# Patient Record
Sex: Male | Born: 1980 | Race: White | Hispanic: Yes | Marital: Married | Smoking: Former smoker
Health system: Southern US, Community
[De-identification: ages and names within clinical notes are randomized; demographics above are authoritative.]

## PROBLEM LIST (undated history)

## (undated) DIAGNOSIS — K922 Gastrointestinal hemorrhage, unspecified: Secondary | ICD-10-CM

## (undated) DIAGNOSIS — K259 Gastric ulcer, unspecified as acute or chronic, without hemorrhage or perforation: Secondary | ICD-10-CM

## (undated) DIAGNOSIS — F1010 Alcohol abuse, uncomplicated: Secondary | ICD-10-CM

## (undated) DIAGNOSIS — Z789 Other specified health status: Secondary | ICD-10-CM

## (undated) HISTORY — PX: NO PAST SURGERIES: SHX2092

---
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
## 2002-07-24 ENCOUNTER — Encounter: Payer: Self-pay | Admitting: Emergency Medicine

## 2002-07-25 ENCOUNTER — Inpatient Hospital Stay (HOSPITAL_COMMUNITY): Admission: EM | Admit: 2002-07-25 | Discharge: 2002-07-27 | Payer: Self-pay | Admitting: Emergency Medicine

## 2007-08-10 ENCOUNTER — Emergency Department (HOSPITAL_COMMUNITY): Admission: EM | Admit: 2007-08-10 | Discharge: 2007-08-10 | Payer: Self-pay | Admitting: Emergency Medicine

## 2007-09-08 ENCOUNTER — Emergency Department (HOSPITAL_COMMUNITY): Admission: EM | Admit: 2007-09-08 | Discharge: 2007-09-08 | Payer: Self-pay | Admitting: Emergency Medicine

## 2007-11-09 ENCOUNTER — Ambulatory Visit (HOSPITAL_COMMUNITY): Admission: RE | Admit: 2007-11-09 | Discharge: 2007-11-09 | Payer: Self-pay | Admitting: Orthopaedic Surgery
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
## 2011-02-18 NOTE — H&P (Signed)
Calvin Carter, Calvin Carter NO.:  [id number]   MEDICAL RECORD NO.:  [id number]                   PATIENT TYPE:  INP   LOCATION:  IC07                                 FACILITY:  [facility]   PHYSICIAN:  Gracelyn Nurse, M.D.              DATE OF BIRTH:  03-25-1981   DATE OF ADMISSION:  07/24/2002  DATE OF DISCHARGE:                                HISTORY & PHYSICAL   CHIEF COMPLAINT:  Chest pain.   HISTORY OF PRESENT ILLNESS:  This is a 30 year old Hispanic male who speaks  no [place].  He presents to the ER with chest pain.  While out in triage he  collapsed.  He was brought back and started on IV fluids, found to be  hypotensive and found to have a hemoglobin of 7.  His brother-in-law, who  was with him, says that he [facility] and  diagnosed and treated for gastritis with medications that he takes twice a  day and told not to drink any alcohol. He since then has been drinking  alcohol.  He says that he vomited bright red blood x1 yesterday and has  started having black-looking stools.  He denies taking any ibuprofen,  aspirin or any powders of any kind.   PAST MEDICAL HISTORY:  Recent diagnosis of gastritis.   ALLERGIES:  No known drug allergy.   CURRENT MEDICATIONS:  Likely an H2 blocker.   SOCIAL HISTORY:  He smokes a pack a day.  He drinks 6 to 8 beers a day.  He  is single.   FAMILY HISTORY:  Mother is in good health.  He does not know his father.   REVIEW OF SYSTEMS:  Was not obtained from the patient because he is sedated  from morphine and Phenergan.   PHYSICAL EXAMINATION:  VITAL SIGNS:  Temperature is 98, pulse 101, respirations 14, blood pressure  90/44.  GENERAL:  This is a well-nourished Hispanic male who has been sedated from  the morphine and Phenergan but will open his eyes.  HEENT:  Pupils equal, round and reactive to light.  Equal ocular movement  intact.  Oral mucosa is dry.  Oropharynx is clear.  CARDIOVASCULAR:  Tachy with no murmurs.  LUNGS:  Clear to auscultation.  ABDOMEN:  Soft, nontender, nondistended.  Bowel sounds are positive.  EXTREMITIES:  No edema.  NEUROLOGIC:  He is sedated but moves all extremities and will respond to  commands.  RECTAL:  Coag-positive.  There is black, tarry stool but no frank melena.   ADMISSION LABORATORIES:  White blood cells 12.5, hemoglobin 7, platelets  251, MCV 81, sodium is 134, potassium 3.7, chloride 105, CO2 24, BUN 31,  creatinine 0.9, glucose is 211.   ASSESSMENT/PLAN:  1. Gastrointestinal bleeding.  It is likely upper gastrointestinal.  He was     diagnosed clinically with gastritis.  He could  have an erosive gastritis     or even an ulcer.  Since he was complaining of chest pain, esophagitis     also has to be considered.  We will go ahead and admit him to the ICU and     transfuse blood, start a proton-pump inhibitor and consult GI for EDT.  2. Anemia.  This is secondary to #1.  Will transfuse him and check iron     studies.  3. Hyperglycemia.  He has no history of diabetes.  This may just be stress     related but we will check a hemoglobin A1C, check a lipase to rule out     pancreatitis and put him on some sliding scale insulin.                                               Gracelyn Nurse, M.D.    JDJ/MEDQ  D:  07/25/2002  T:  07/25/2002  Job:  [id number]

## 2011-02-18 NOTE — Discharge Summary (Signed)
   NAMEANCELMO, HUNT NO.:  [id number]   MEDICAL RECORD NO.:  [id number]                   PATIENT TYPE:  INP   LOCATION:  A223                                 FACILITY:  [facility]   PHYSICIAN:  Hanley Hays. Dechurch, M.D.           DATE OF BIRTH:  02/14/1981   DATE OF ADMISSION:  07/24/2002  DATE OF DISCHARGE:  07/27/2002                                 DISCHARGE SUMMARY   DIAGNOSES:  1. Anemia secondary to gastrointestinal bleed.  2. Duodenal ulcer status post heater probe and epinephrine injection.  3. Helicobacter pylori positive.  4. Hypoalbuminemia.  5. Elevated glucose with normal hemoglobin A1c.   CONDITION:  Improved.   PROCEDURE:  Endoscopy, status post 3 unit packed red blood cells  transfusion.   MEDICATIONS:  Protonix or PPI of choice daily (to be arranged via Dr.  Luvenia Starch office for samples).   FOLLOW-UP:  1. H&H July 29, 2002.  2. Follow-up with Dr. Jena Gauss to be scheduled July 29, 2002.  3. H. pylori treatment, again, per Dr. Jena Gauss, who will provide medication     for the patient for treatment.   HOSPITAL COURSE:  A 30 year old Hispanic male who speaks no English  presented to the emergency room complaining of chest pain, had a syncopal  episode in triage.  Subsequent workup revealed a hemoglobin of 7.  He had  heme positive stools.  He underwent endoscopy which revealed duodenal  ulceration with bulbar ulcer which was treated with thermal ablation  injection therapy.  The patient subsequently had his H. pylori return at  2.99.  Arrangements were made for him to obtain medications as an outpatient  and he was discharged to home in improved condition.  Hemoglobin at the time  of discharge was 8.9 and stable.  He had no symptoms and felt well, was  taking a full diet without any complaints.   PHYSICAL EXAMINATION:  GENERAL:  Reveals a thin Hispanic male, no distress.  LUNGS:  Clear.  ABDOMEN:  Soft, nontender, active bowel  sounds.  EXTREMITIES:  Without clubbing, cyanosis, or edema.  NEUROLOGIC:  Grossly intact as best can be assessed with the language  barriers.    DISPOSITION:  Interpretation was obtained to help explain discharge planning  with the patient and family and they seemed to have reasonable  understanding.                                                 Hanley Hays Josefine Class, M.D.    FED/MEDQ  D:  09/27/2002  T:  09/28/2002  Job:  [id number]

## 2011-02-18 NOTE — Discharge Summary (Signed)
NAMEHAYVEN, Calvin Carter NO.:  [id number]   MEDICAL RECORD NO.:  [id number]                   PATIENT TYPE:  INP   LOCATION:  A223                                 FACILITY:  [facility]   PHYSICIAN:  Calvin Hays. Dechurch, M.D.           DATE OF BIRTH:  Dec 28, 1980   DATE OF ADMISSION:  07/24/2002  DATE OF DISCHARGE:  07/27/2002                                 DISCHARGE SUMMARY   DISCHARGE DIAGNOSES:  1. Gastrointestinal bleeding secondary to bulbar ulcer requiring packed red     blood cell transfusion.  2. Acute anemia.  3. Hyperglycemia with normal hemoglobin A1C.  4. Helicobacter pylori-positive.   DISPOSITION:  The patient is discharged to home.   MEDICATIONS:  Protonix or PPI available b.i.d.  Prevpac.  Supplies will be  arranged to be obtained through Dr. Patty Sermons office.  The patient was  advised to take no nonsteroidals, aspirin, or over-the-counter medicines  with the exception of Tylenol.   HOSPITAL COURSE:  The patient is a 30 year old healthy Hispanic gentleman  who presented with nausea, vomiting, and hypotension.  Hemoglobin upon  presentation was 7.  Review of systems revealed the patient had been using  nonsteroidals.  The patient spoke no [place], and all information was  obtained through translators and family members.  The patient underwent  endoscopy per Dr. Roetta Sessions.  Findings revealed a normal esophagus.  The  stomach revealed a bulbar ulcer with lots of inflammatory changes.  He  underwent thermal ablation.  The patient tolerated the procedure well.  His  hemoglobin at the time of discharge was 8.7.  He had no evidence of blood in  the stools.  He was eating well, feeling well, was not symptomatic.  The  patient was discharged, to follow up with Dr. Jena Gauss.  His H. pylori did come  back positive and, as noted above, plans were made to obtain medicine for  the patient as an outpatient.   PHYSICAL EXAMINATION:  GENERAL:  Thin  Hispanic white male in no distress.  VITAL SIGNS:  Blood pressure 120/70, pulse 78 and regular, respirations  unlabored.  HEENT:  Oropharynx moist.  NECK:  Supple.  LUNGS:  Clear.  ABDOMEN:  Flat.  Soft and nontender.  With active bowel sounds.  HEART:  Regular rate and rhythm.  EXTREMITIES:  Without clubbing, cyanosis, or edema.  SKIN:  Without rash, lesion, or breakdown.  NEUROLOGIC:  Intact.    ASSESSMENT AND PLAN:  As noted above.  The patient has follow-up arranged  with Drs. Rourk and Rehman in one month.                                               Calvin Carter, M.D.    FED/MEDQ  D:  08/29/2002  T:  08/29/2002  Job:  [id number]

## 2011-02-18 NOTE — Op Note (Signed)
Calvin Carter, Calvin Carter                           ACCOUNT NO.:  [id number]   MEDICAL RECORD NO.:  [id number]                   PATIENT TYPE:  INP   LOCATION:  IC07                                 FACILITY:  [facility]   PHYSICIAN:  R. Roetta Sessions, M.D.              DATE OF BIRTH:  09/01/1981   DATE OF PROCEDURE:  07/25/2002  DATE OF DISCHARGE:                                 OPERATIVE REPORT   PROCEDURE:  Esophagogastroduodenoscopy with thermal sealing and injection  therapy for bleeding duodenal lesion.   INDICATION FOR PROCEDURE:  The patient is a 30 year old Hispanic male  admitted to the hospital with Hemoccult-positive stool, profound anemia,  hemoglobin 7.  He has received three units of packed red blood cells.  EGD  is now being done to further evaluate his symptoms.  This approach has been  discussed with the patient via an interpreter.  The potential risks,  benefits, and alternatives have been reviewed and questions answered.  He  has been started on IV Protonix.  EGD is now being done with therapy to  contain as appropriate.  This approach has been discussed with the patient.  ASA-1.   DESCRIPTION OF PROCEDURE:  O2 saturation, blood pressure, pulse, and  respirations were monitored throughout the entire procedure.   Conscious sedation:  6 mg of Versed, Demerol 125 mg in divided doses.   Instrument:  Olympus video chip adult gastroscope.   Findings:  Examination of the tubular esophagus revealed no mucosal  abnormalities.  The EG junction was easily traversed.   Stomach:  Gastric cavity was empty, insufflated well with air.  A very  thorough examination of the gastric mucosa, including retroflexed view of  the proximal stomach and esophagogastric junction, demonstrated no  abnormalities.  Pylorus patent and easily traversed.  Examination of the  bulb revealed mucosal edema with a 0.5 cm ulcer in the bulb with brisk  oozing out of the adjacent mucosa, please see photos.  D2  appeared normal.  This area of brisk oozing was treated x2 with a heater probe at 30 joules  each.  Oozing persisted, 4 cc of 1:100,000 epinephrine was injected in the  area with a decrease in the amount of oozing.  Bleeding slowed considerably.  The patient became uncooperative during the procedure, which limited further  treatment.  The patient overall tolerated the procedure very well and was  reactivated in endoscopy.   IMPRESSION:  1. Normal esophagus.  2. Normal stomach.  3. Bulbar ulcer with surrounding inflammatory changes and oozing mucosa,     treated with thermal ablation and injection therapy as described above.     D2 appeared normal.   RECOMMENDATIONS:  1. Follow H&H closely.  2. Continue IV PPI.  3. Carafate 1 g slurry q.i.d.  4. Check H. pylori status.  The patient should make sure that he does not     take any nonsteroidals  in the future.  5. Further recommendations to follow.                                               Jonathon Bellows, M.D.    RMR/MEDQ  D:  07/25/2002  T:  07/25/2002  Job:  [id number]   cc:   Gracelyn Nurse, M.D.

## 2014-05-04 ENCOUNTER — Emergency Department (HOSPITAL_COMMUNITY): Payer: Medicaid Other

## 2014-05-04 ENCOUNTER — Inpatient Hospital Stay (HOSPITAL_COMMUNITY)
Admission: EM | Admit: 2014-05-04 | Discharge: 2014-05-07 | DRG: 378 | Disposition: A | Discharge status: Home / Self Care | Payer: Medicaid Other | Attending: Internal Medicine | Admitting: Internal Medicine

## 2014-05-04 ENCOUNTER — Encounter (HOSPITAL_COMMUNITY): Payer: Self-pay | Admitting: Emergency Medicine

## 2014-05-04 DIAGNOSIS — F1010 Alcohol abuse, uncomplicated: Secondary | ICD-10-CM | POA: Diagnosis present

## 2014-05-04 DIAGNOSIS — K259 Gastric ulcer, unspecified as acute or chronic, without hemorrhage or perforation: Secondary | ICD-10-CM | POA: Diagnosis present

## 2014-05-04 DIAGNOSIS — Z8711 Personal history of peptic ulcer disease: Secondary | ICD-10-CM

## 2014-05-04 DIAGNOSIS — R197 Diarrhea, unspecified: Secondary | ICD-10-CM

## 2014-05-04 DIAGNOSIS — F17200 Nicotine dependence, unspecified, uncomplicated: Secondary | ICD-10-CM | POA: Diagnosis present

## 2014-05-04 DIAGNOSIS — E876 Hypokalemia: Secondary | ICD-10-CM | POA: Diagnosis present

## 2014-05-04 DIAGNOSIS — R112 Nausea with vomiting, unspecified: Secondary | ICD-10-CM

## 2014-05-04 DIAGNOSIS — D62 Acute posthemorrhagic anemia: Secondary | ICD-10-CM | POA: Diagnosis present

## 2014-05-04 DIAGNOSIS — K922 Gastrointestinal hemorrhage, unspecified: Principal | ICD-10-CM | POA: Diagnosis present

## 2014-05-04 DIAGNOSIS — M542 Cervicalgia: Secondary | ICD-10-CM | POA: Diagnosis present

## 2014-05-04 DIAGNOSIS — D649 Anemia, unspecified: Secondary | ICD-10-CM

## 2014-05-04 DIAGNOSIS — K921 Melena: Secondary | ICD-10-CM | POA: Diagnosis present

## 2014-05-04 HISTORY — DX: Alcohol abuse, uncomplicated: F10.10

## 2014-05-04 HISTORY — DX: Gastric ulcer, unspecified as acute or chronic, without hemorrhage or perforation: K25.9

## 2014-05-04 HISTORY — DX: Gastrointestinal hemorrhage, unspecified: K92.2

## 2014-05-04 HISTORY — DX: Other specified health status: Z78.9

## 2014-05-04 LAB — CBC WITH DIFFERENTIAL/PLATELET
Basophils Absolute: 0 10*3/uL (ref 0.0–0.1)
Basophils Relative: 0 % (ref 0–1)
EOS PCT: 0 % (ref 0–5)
Eosinophils Absolute: 0 10*3/uL (ref 0.0–0.7)
HEMATOCRIT: 12.8 % — AB (ref 39.0–52.0)
HEMOGLOBIN: 4.5 g/dL — AB (ref 13.0–17.0)
LYMPHS ABS: 4.7 10*3/uL — AB (ref 0.7–4.0)
LYMPHS PCT: 35 % (ref 12–46)
MCH: 31.9 pg (ref 26.0–34.0)
MCHC: 35.2 g/dL (ref 30.0–36.0)
MCV: 90.8 fL (ref 78.0–100.0)
MONO ABS: 0.9 10*3/uL (ref 0.1–1.0)
MONOS PCT: 7 % (ref 3–12)
Neutro Abs: 7.7 10*3/uL (ref 1.7–7.7)
Neutrophils Relative %: 58 % (ref 43–77)
Platelets: 221 10*3/uL (ref 150–400)
RBC: 1.41 MIL/uL — AB (ref 4.22–5.81)
RDW: 12.9 % (ref 11.5–15.5)
WBC: 13.4 10*3/uL — AB (ref 4.0–10.5)

## 2014-05-04 LAB — COMPREHENSIVE METABOLIC PANEL
ALT: 11 U/L (ref 0–53)
AST: 10 U/L (ref 0–37)
Albumin: 2.7 g/dL — ABNORMAL LOW (ref 3.5–5.2)
Alkaline Phosphatase: 32 U/L — ABNORMAL LOW (ref 39–117)
Anion gap: 10 (ref 5–15)
BUN: 22 mg/dL (ref 6–23)
CALCIUM: 8 mg/dL — AB (ref 8.4–10.5)
CO2: 23 meq/L (ref 19–32)
CREATININE: 0.84 mg/dL (ref 0.50–1.35)
Chloride: 102 mEq/L (ref 96–112)
Glucose, Bld: 148 mg/dL — ABNORMAL HIGH (ref 70–99)
Potassium: 3.6 mEq/L — ABNORMAL LOW (ref 3.7–5.3)
Sodium: 135 mEq/L — ABNORMAL LOW (ref 137–147)
Total Bilirubin: 0.1 mg/dL — ABNORMAL LOW (ref 0.3–1.2)
Total Protein: 5.1 g/dL — ABNORMAL LOW (ref 6.0–8.3)

## 2014-05-04 LAB — URINALYSIS, ROUTINE W REFLEX MICROSCOPIC
Bilirubin Urine: NEGATIVE
Glucose, UA: NEGATIVE mg/dL
Hgb urine dipstick: NEGATIVE
Ketones, ur: NEGATIVE mg/dL
Leukocytes, UA: NEGATIVE
Nitrite: NEGATIVE
Protein, ur: NEGATIVE mg/dL
SPECIFIC GRAVITY, URINE: 1.01 (ref 1.005–1.030)
UROBILINOGEN UA: 0.2 mg/dL (ref 0.0–1.0)
pH: 6 (ref 5.0–8.0)

## 2014-05-04 LAB — PROTIME-INR
INR: 1.13 (ref 0.00–1.49)
Prothrombin Time: 14.5 seconds (ref 11.6–15.2)

## 2014-05-04 LAB — LIPASE, BLOOD: Lipase: 50 U/L (ref 11–59)

## 2014-05-04 LAB — ETHANOL

## 2014-05-04 LAB — MRSA PCR SCREENING: MRSA by PCR: NEGATIVE

## 2014-05-04 LAB — PREPARE RBC (CROSSMATCH)

## 2014-05-04 MED ORDER — ONDANSETRON HCL 4 MG PO TABS
4.0000 mg | ORAL_TABLET | Freq: Four times a day (QID) | ORAL | Status: DC | PRN
Start: 2014-05-04 — End: 2014-05-07

## 2014-05-04 MED ORDER — THIAMINE HCL 100 MG/ML IJ SOLN
100.0000 mg | Freq: Every day | INTRAMUSCULAR | Status: DC
  Administered 2014-05-04 – 2014-05-07 (×4): 100 mg via INTRAVENOUS
  Filled 2014-05-04 (×4): qty 2

## 2014-05-04 MED ORDER — SODIUM CHLORIDE 0.9 % IV SOLN
8.0000 mg/h | INTRAVENOUS | Status: DC
  Administered 2014-05-04 – 2014-05-06 (×3): 8 mg/h via INTRAVENOUS
  Filled 2014-05-04 (×8): qty 80

## 2014-05-04 MED ORDER — PANTOPRAZOLE SODIUM 40 MG IV SOLR
80.0000 mg | Freq: Once | INTRAVENOUS | Status: AC
  Administered 2014-05-04: 80 mg via INTRAVENOUS
  Filled 2014-05-04: qty 80

## 2014-05-04 MED ORDER — ACETAMINOPHEN 325 MG PO TABS
650.0000 mg | ORAL_TABLET | Freq: Four times a day (QID) | ORAL | Status: DC | PRN
  Administered 2014-05-04: 650 mg via ORAL
  Filled 2014-05-04: qty 2

## 2014-05-04 MED ORDER — SODIUM CHLORIDE 0.9 % IV BOLUS (SEPSIS)
500.0000 mL | Freq: Once | INTRAVENOUS | Status: AC
  Administered 2014-05-04: 500 mL via INTRAVENOUS

## 2014-05-04 MED ORDER — HYPROMELLOSE (GONIOSCOPIC) 2.5 % OP SOLN
1.0000 [drp] | OPHTHALMIC | Status: DC | PRN
Start: 2014-05-04 — End: 2014-05-05
  Filled 2014-05-04: qty 15

## 2014-05-04 MED ORDER — ONDANSETRON HCL 4 MG/2ML IJ SOLN: 4.0000 mg | Freq: Four times a day (QID) | INTRAMUSCULAR | Status: DC | PRN

## 2014-05-04 MED ORDER — SODIUM CHLORIDE 0.9 % IV SOLN
INTRAVENOUS | Status: DC
  Administered 2014-05-06: 01:00:00 via INTRAVENOUS

## 2014-05-04 MED ORDER — POTASSIUM CHLORIDE 10 MEQ/100ML IV SOLN
10.0000 meq | INTRAVENOUS | Status: AC
  Administered 2014-05-04 – 2014-05-05 (×4): 10 meq via INTRAVENOUS
  Filled 2014-05-04 (×2): qty 100

## 2014-05-04 MED ORDER — FOLIC ACID 5 MG/ML IJ SOLN
INTRAMUSCULAR | Status: AC
  Filled 2014-05-04: qty 0.2

## 2014-05-04 MED ORDER — FOLIC ACID 5 MG/ML IJ SOLN
1.0000 mg | Freq: Every day | INTRAMUSCULAR | Status: DC
  Administered 2014-05-04 – 2014-05-06 (×3): 1 mg via INTRAVENOUS
  Filled 2014-05-04 (×7): qty 0.2

## 2014-05-04 MED ORDER — ACETAMINOPHEN 650 MG RE SUPP: 650.0000 mg | Freq: Four times a day (QID) | RECTAL | Status: DC | PRN

## 2014-05-04 MED ORDER — SODIUM CHLORIDE 0.9 % IV SOLN
10.0000 mL/h | Freq: Once | INTRAVENOUS | Status: AC
  Administered 2014-05-04: 10 mL/h via INTRAVENOUS

## 2014-05-04 MED ORDER — LORAZEPAM 2 MG/ML IJ SOLN: 2.0000 mg | INTRAMUSCULAR | Status: DC | PRN

## 2014-05-04 MED ORDER — SODIUM CHLORIDE 0.9 % IV SOLN
INTRAVENOUS | Status: DC
  Administered 2014-05-04: 16:00:00 via INTRAVENOUS

## 2014-05-04 MED ORDER — SODIUM CHLORIDE 0.9 % IV SOLN
INTRAVENOUS | Status: DC
  Administered 2014-05-04: 11:00:00 via INTRAVENOUS

## 2014-05-04 NOTE — ED Notes (Signed)
CRITICAL VALUE ALERT  Critical value received:  Hbg-4.5   Date of notification:  05/04/14  Time of notification:  1101  Critical value read back:Yes.    Nurse who received alert:  t Lesle Faron rn  MD notified (1st page):  mcmanus  Time of first page:  1101  MD notified (2nd page):  Time of second page:  Responding MD:    Time MD responded:

## 2014-05-04 NOTE — Progress Notes (Deleted)
[facility] History and Physical  Calvin PeerDavid Erdmann ION:[id number]:[id number] DOB: [birth year] DOA: 05/04/2014  Referring physician: Dr. Clarene DukeMcManus, ER physician PCP: No PCP Per Patient   Chief Complaint: Frequent maroon colored stools.  HPI: Calvin Carter is a 33 y.o. male who presents to the emergency room today with complaints of multiple episodes of maroon-colored stools. He feels generally weak. Denies any NSAID use. He continues to drink alcohol regularly, mostly on weekends. He reportedly drinks 3 cases of beer over the weekend. His last bowel movement was yesterday. History is somewhat limited since patient is not speaking. His family is interpreting for him. However for the emergency room, hemoglobin was found to be markedly elevated at 4.5. He date admitted to the ICU for further management   Review of Systems:  Pertinent positives as per history of present illness, otherwise negative   Past Medical History  Diagnosis Date  . Alcohol abuse   . Stomach ulcer 2003  . GI bleeding 2003  . Medical history non-contributory    Past Surgical History  Procedure Laterality Date  . No past surgeries     Social History:  reports that he has been smoking Cigarettes.  He has a .5 pack-year smoking history. He does not have any smokeless tobacco history on file. He reports that he drinks alcohol. He reports that he uses illicit drugs (Cocaine) about once per week.  No Known Allergies  History reviewed. No pertinent family history.   Prior to Admission medications   Medication Sig Start Date End Date Taking? Authorizing Provider  acetaminophen (TYLENOL) 500 MG tablet Take 1,000 mg by mouth every 6 (six) hours as needed for moderate pain.   Yes Historical Provider, MD  hydroxypropyl methylcellulose (ISOPTO TEARS) 2.5 % ophthalmic solution Place 1 drop into both eyes as needed for dry eyes.   Yes Historical Provider, MD  promethazine (PHENERGAN) 25 MG tablet Take 25 mg by mouth every 6 (six) hours as  needed for nausea or vomiting.   Yes Historical Provider, MD   Physical Exam: Filed Vitals:   05/04/14 1330 05/04/14 1354 05/04/14 1400 05/04/14 1500  BP: 95/59 108/58 116/57 119/49  Pulse: 91 89 91 90  Temp:  100.3 F (37.9 C) 99.4 F (37.4 C)   TempSrc:   Oral   Resp: 19 18 19 23   Height:    5\' 8"  (1.727 m)  Weight:    86.3 kg (190 lb 4.1 oz)  SpO2: 99% 100% 100% 100%    Wt Readings from Last 3 Encounters:  05/04/14 86.3 kg (190 lb 4.1 oz)    General:  The patient lying in bed, opens eyes to voice, does not appear to be in any distress Eyes: PERRL, normal lids, irises & conjunctiva ENT: Mucous membranes are pale Neck: no LAD, masses or thyromegaly Cardiovascular: S1, S2, tachycardic, no m/r/g. No LE edema. Telemetry: SR, no arrhythmias  Respiratory: CTA bilaterally, no w/r/r. Normal respiratory effort. Abdomen: soft, ntnd, positive bowel sounds Skin: no rash or induration seen on limited exam Musculoskeletal: grossly normal tone BUE/BLE Psychiatric: grossly normal mood and affect, speech fluent and appropriate Neurologic: grossly non-focal.          Labs on Admission:  Basic Metabolic Panel:  Recent Labs Lab 05/04/14 1039  NA 135*  K 3.6*  CL 102  CO2 23  GLUCOSE 148*  BUN 22  CREATININE 0.84  CALCIUM 8.0*   Liver Function Tests:  Recent Labs Lab 05/04/14 1039  AST 10  ALT 11  ALKPHOS  32*  BILITOT 0.1*  PROT 5.1*  ALBUMIN 2.7*    Recent Labs Lab 05/04/14 1039  LIPASE 50   No results found for this basename: AMMONIA,  in the last 168 hours CBC:  Recent Labs Lab 05/04/14 1039  WBC 13.4*  NEUTROABS 7.7  HGB 4.5*  HCT 12.8*  MCV 90.8  PLT 221   Cardiac Enzymes: No results found for this basename: CKTOTAL, CKMB, CKMBINDEX, TROPONINI,  in the last 168 hours  BNP (last 3 results) No results found for this basename: PROBNP,  in the last 8760 hours CBG: No results found for this basename: GLUCAP,  in the last 168 hours  Radiological  Exams on Admission: Dg Abd Acute W/chest  05/04/2014   CLINICAL DATA:  Rectal bleeding, nausea and vomiting  EXAM: ACUTE ABDOMEN SERIES (ABDOMEN 2 VIEW & CHEST 1 VIEW)  COMPARISON:  None.  FINDINGS: There is no evidence of dilated bowel loops or free intraperitoneal air. No radiopaque calculi or other significant radiographic abnormality is seen. Heart size and mediastinal contours are within normal limits. Both lungs are clear.  IMPRESSION: Negative abdominal radiographs.  No acute cardiopulmonary disease.   Electronically Signed   By: Christiana Pellant M.D.   On: 05/04/2014 11:55    Assessment/Plan Active Problems:   GI bleeding   Acute blood loss anemia   GI bleed   Alcohol abuse   1. GI bleeding. Patient's last maroon-colored stool was yesterday. Hopefully the bleeding has stopped. Case was discussed with Dr. Karilyn Cota who plans on endoscopy in the morning. He is hemodynamically stable. He started on Protonix infusion. Continue to check serial CBCs. 2. Acute blood loss anemia. According to records, patient had gone to [facility] hospital approximately 3 days ago for diarrhea. At that time, she was noted to be greater than 11. It was felt that he may have a gastroenteritis patient was discharged. The patient has an antibody which has delayed obtaining blood from the blood bank. He was transfused 2 units of PRBCs right now, and likely another 2 units depending on response to initial transfusion. Check anemia panel 3. History of alcohol abuse. We'll start alcohol withdrawal protocol.  Code Status: full code DVT Prophylaxis: SCDs Family Communication: discussed with family at the bedside Disposition Plan: discharge home once improved  Time spent:  [facility] Pager [phone]  **Disclaimer: This note may have been dictated with voice recognition software. Similar sounding words can inadvertently be transcribed and this note may contain transcription errors which may not  have been corrected upon publication of note.**

## 2014-05-04 NOTE — ED Notes (Signed)
Pt c/o n/bloody diarrhea x 5 days. Some vomiting on Thursday. Pt lethargic and pale.

## 2014-05-04 NOTE — Consult Note (Signed)
Referring Provider: Dr. Erick BlinksJehanzeb [name], MD Primary Care Physician:  No PCP Per Patient Primary Gastroenterologist:  Dr. Karilyn Cotaehman  Reason for Consultation:    GI bleed and profound anemia.  HPI:   Patient does not speak in place. History was obtained with the help of his knees and right for both at bedside.  Patient is 33 year old Hispanic male who was in usual state of health until 6 days when he developed diarrhea followed by vomiting 3 days later. He apparently did not have hematemesis melena or rectal bleeding. Patient was evaluated in [facility] emergency room [facility] [place] on 05/02/2014. His hemoglobin was 11.6 and BUN was elevated at 33. He was felt to have gastroenteritis. He was treated and discharged. This morning he told his wife that he was passing maroon-colored stools and he was feeling very weak. He was therefore brought to the emergency room. Last BM was yesterday. Evaluation in the emergency room reveals him to have a hemoglobin of 4.5 g. He was therefore admitted to ICU. He was begun on pantoprazole infusion and will be receiving 2 units of PRBCs. Patient has been taking Tylenol on when necessary basis. He denies using OTC NSAIDs. He drinks beer usually on weekends. His niece states that he drinks as many as 72 cans of beer every weekend. He smokes one to 2 cigarettes per day but not every day. Review of the systems is negative for heartburn dysphagia abdominal pain or weight loss. He is married and has 2 children ages 2310 and 775. He's been living in states for [place] years. He did not graduate from high school. He can read and write in Spanish. His work involves putting towels folding and carpets. Both his parents are in good health. He has 3 brothers and sisters in good health.        Past Medical History  Diagnosis Date  . Alcohol abuse     3 unit upper GI bleed secondary to duodenal ulcer  It is unclear whether he's been treated for H.  pylori are not  2003  . GI bleeding 2003  . Medical history non-contributory     Past Surgical History  Procedure Laterality Date  . No past surgeries      Prior to Admission medications   Medication Sig Start Date End Date Taking? Authorizing Provider  acetaminophen (TYLENOL) 500 MG tablet Take 1,000 mg by mouth every 6 (six) hours as needed for moderate pain.   Yes Historical Provider, MD  hydroxypropyl methylcellulose (ISOPTO TEARS) 2.5 % ophthalmic solution Place 1 drop into both eyes as needed for dry eyes.   Yes Historical Provider, MD  promethazine (PHENERGAN) 25 MG tablet Take 25 mg by mouth every 6 (six) hours as needed for nausea or vomiting.   Yes Historical Provider, MD    Current Facility-Administered Medications  Medication Dose Route Frequency Provider Last Rate Last Dose  . 0.9 %  sodium chloride infusion   Intravenous Continuous Erick BlinksJehanzeb [name], MD      . acetaminophen (TYLENOL) tablet 650 mg  650 mg Oral Q6H PRN Erick BlinksJehanzeb [name], MD       Or  . acetaminophen (TYLENOL) suppository 650 mg  650 mg Rectal Q6H PRN Erick BlinksJehanzeb [name], MD      . folic acid injection 1 mg  1 mg Intravenous Daily Erick BlinksJehanzeb [name], MD      . hydroxypropyl methylcellulose (ISOPTO TEARS) 2.5 % ophthalmic solution 1 drop  1 drop Both Eyes PRN Erick BlinksJehanzeb [name],  MD      . LORazepam (ATIVAN) injection 2-3 mg  2-3 mg Intravenous Q1H PRN Erick Blinks, MD      . ondansetron (ZOFRAN) tablet 4 mg  4 mg Oral Q6H PRN Erick Blinks, MD       Or  . ondansetron (ZOFRAN) injection 4 mg  4 mg Intravenous Q6H PRN Erick Blinks, MD      . pantoprazole (PROTONIX) 80 mg in sodium chloride 0.9 % 250 mL (0.32 mg/mL) infusion  8 mg/hr Intravenous Continuous Laray Anger, DO 25 mL/hr at 05/04/14 1600 8 mg/hr at 05/04/14 1600  . potassium chloride 10 mEq in 100 mL IVPB  10 mEq Intravenous Q1 Hr x 4 Erick Blinks, MD      . thiamine (B-1) injection 100 mg  100 mg Intravenous Daily Erick Blinks, MD      . [DISCONTINUED]  0.9 %  sodium chloride infusion   Intravenous STAT Laray Anger, DO 125 mL/hr at 05/04/14 1600      Allergies as of 05/04/2014  . (No Known Allergies)    History reviewed. No pertinent family history.  History   Social History  . Marital Status: Married    Spouse Name: N/A    Number of Children: N/A  . Years of Education: N/A   Occupational History  . Not on file.   Social History Main Topics  . Smoking status: Current Some Day Smoker -- 0.25 packs/day for 2 years    Types: Cigarettes  . Smokeless tobacco: Not on file     Comment: Pt lethargic and uninvolved with inital admission screen; Family at bedside   . Alcohol Use: Yes     Comment: moderate  . Drug Use: 1.00 per week    Special: Cocaine  . Sexual Activity: Yes    Birth Control/ Protection: None   Other Topics Concern  . Not on file   Social History Narrative  . No narrative on file    Review of Systems: See HPI, otherwise normal ROS  Physical Exam: Temp:  [98 F (36.7 C)-100.3 F (37.9 C)] 99.4 F (37.4 C) (08/02 1400) Pulse Rate:  [89-113] 90 (08/02 1500) Resp:  [13-23] 23 (08/02 1500) BP: (92-119)/(43-59) 119/49 mmHg (08/02 1500) SpO2:  [98 %-100 %] 100 % (08/02 1500) Weight:  [190 lb 4.1 oz (86.3 kg)-195 lb (88.451 kg)] 190 lb 4.1 oz (86.3 kg) (08/02 1500) Last BM Date: 05/03/14 Patient is a well-developed well-nourished Hispanic male in no acute distress. Conjunctiva is pink. Sclerae nonicteric. Oropharyngeal mucosa is normal other than hard palate is very pale No neck masses or thyromegaly noted. Cardiac exam with regular rhythm normal S1 and S2. No murmur or gallop noted. Abdomen is symmetrical. Bowel sounds are normal. On palpation it is soft and nontender without organomegaly or masses. No peripheral edema or clubbing noted.      .  I Lab Results:  Recent Labs  05/04/14 1039  WBC 13.4*  HGB 4.5*  HCT 12.8*  PLT 221   BMET  Recent Labs  05/04/14 1039  NA 135*  K  3.6*  CL 102  CO2 23  GLUCOSE 148*  BUN 22  CREATININE 0.84  CALCIUM 8.0*   LFT  Recent Labs  05/04/14 1039  PROT 5.1*  ALBUMIN 2.7*  AST 10  ALT 11  ALKPHOS 32*  BILITOT 0.1*   PT/INR  Recent Labs  05/04/14 1039  LABPROT 14.5  INR 1.13   Hepatitis Panel No results found for  this basename: HEPBSAG, HCVAB, HEPAIGM, HEPBIGM,  in the last 72 hours  Studies/Results: Dg Abd Acute W/chest  05/04/2014   CLINICAL DATA:  Rectal bleeding, nausea and vomiting  EXAM: ACUTE ABDOMEN SERIES (ABDOMEN 2 VIEW & CHEST 1 VIEW)  COMPARISON:  None.  FINDINGS: There is no evidence of dilated bowel loops or free intraperitoneal air. No radiopaque calculi or other significant radiographic abnormality is seen. Heart size and mediastinal contours are within normal limits. Both lungs are clear.  IMPRESSION: Negative abdominal radiographs.  No acute cardiopulmonary disease.   Electronically Signed   By: Christiana Pellant M.D.   On: 05/04/2014 11:55    Assessment; Patient is 33 year old Hispanic male with remote history of 300 upper GI bleed secondary to duodenal ulcer(2003) who presents with history of passing maroon stools and hemoglobin of 4.5 g. His illness began with diarrhea followed by vomiting and then bloody stools. He does not take NSAIDs. He drinks several cans of beer every weekend. Differential diagnoses include recurrent peptic ulcer disease or Mallory-Weiss tear with bleeding in a patient with history of vomiting. He does not have stigmata of chronic liver disease. Agree with current therapy with PPI infusion and plans for PRBC transfusion.  Recommendations; Will allow patient clear liquids today. Esophagogastroduodenoscopy under monitored anesthesia care in a.m. Consider referral for rehabilitation when medically stable.   LOS: 0 days   REHMAN,NAJEEB U  05/04/2014, 4:24 PM

## 2014-05-04 NOTE — ED Notes (Signed)
Temp-100.3, WBC-13.4,abd soft/nontender. Pt remains pale and lethargic. JVD noted. Blood available in 1-2 hours. Dr. Kerry HoughMemon notified. UA ordered. Okay to send pt to ICU 5, Dr. Kerry HoughMemon to see pt upstairs.

## 2014-05-04 NOTE — ED Notes (Signed)
RN called Lab, spoke with Rosalita ChessmanSuzanne to check on status of blood, states blood should be available in 1-2 hours.

## 2014-05-04 NOTE — ED Notes (Signed)
RN spoke [facility] availability may be delayed up to 4 hours d/t antibody. MD notified.

## 2014-05-04 NOTE — ED Provider Notes (Signed)
CSN: [id number]     Arrival date & time 05/04/14  1029 History   First MD Initiated Contact with Patient 05/04/14 1041     Chief Complaint  Patient presents with  . Rectal Bleeding     HPI Pt was seen at 1045. Per pt and his family, c/o gradual onset and persistence of multiple intermittent episodes of N/V/D that began 5 days ago. Describes the stools as "maroon colored." Has been associated with upper abd "pain." Pt has hx of similar symptoms, dx "ulcer." Pt has not been taking his GI medications and has not f/u with GI MD "for a while." Continues to drink alcohol regularly, "mostly on the weekends." Pt's family states pt was evaluated at [facility] 3 days ago, dx gastroenteritis, rx phenergan. Denies ASA/NSAID use. Denies CP/SOB, no back pain, no fevers, no black or blood in emesis.    Past Medical History  Diagnosis Date  . Alcohol abuse   . Stomach ulcer 2003  . GI bleeding 2003   History reviewed. No pertinent past surgical history.  History  Substance Use Topics  . Smoking status: Current Every Day Smoker  . Smokeless tobacco: Not on file  . Alcohol Use: Yes     Comment: moderate    Review of Systems ROS: Statement: All systems negative except as marked or noted in the HPI; Constitutional: Negative for fever and chills. ; ; Eyes: Negative for eye pain, redness and discharge. ; ; ENMT: Negative for ear pain, hoarseness, nasal congestion, sinus pressure and sore throat. ; ; Cardiovascular: Negative for chest pain, palpitations, diaphoresis, dyspnea and peripheral edema. ; ; Respiratory: Negative for cough, wheezing and stridor. ; ; Gastrointestinal: +N/V/D, abd pain, blood in stool. Negative for hematemesis, jaundice. . ; ; Genitourinary: Negative for dysuria, flank pain and hematuria. ; ; Musculoskeletal: Negative for back pain and neck pain. Negative for swelling and trauma.; ; Skin: Negative for pruritus, rash, abrasions, blisters, bruising and skin lesion.; ; Neuro:  Negative for headache, lightheadedness and neck stiffness. Negative for weakness, altered level of consciousness , altered mental status, extremity weakness, paresthesias, involuntary movement, seizure and syncope.      Allergies  Review of patient's allergies indicates no known allergies.  Home Medications   Prior to Admission medications   Medication Sig Start Date End Date Taking? Authorizing Provider  acetaminophen (TYLENOL) 500 MG tablet Take 1,000 mg by mouth every 6 (six) hours as needed for moderate pain.   Yes Historical Provider, MD  hydroxypropyl methylcellulose (ISOPTO TEARS) 2.5 % ophthalmic solution Place 1 drop into both eyes as needed for dry eyes.   Yes Historical Provider, MD  promethazine (PHENERGAN) 25 MG tablet Take 25 mg by mouth every 6 (six) hours as needed for nausea or vomiting.   Yes Historical Provider, MD   BP 108/58  Pulse 89  Temp(Src) 100.3 F (37.9 C) (Oral)  Resp 18  Ht 5\' 10"  (1.778 m)  Wt 195 lb (88.451 kg)  BMI 27.98 kg/m2  SpO2 100% Physical Exam 1050: Physical examination:  Nursing notes reviewed; Vital signs and O2 SAT reviewed;  Constitutional: Well developed, Well nourished, In no acute distress; Head:  Normocephalic, atraumatic; Eyes: EOMI, PERRL, No scleral icterus; ENMT: Mouth and pharynx normal, Mucous membranes dry; Neck: Supple, Full range of motion, No lymphadenopathy; Cardiovascular: Tachycardic rate and rhythm, No gallop; Respiratory: Breath sounds clear & equal bilaterally, No rales, rhonchi, wheezes.  Speaking full sentences with ease, Normal respiratory effort/excursion; Chest: Nontender, Movement normal; Abdomen: Soft, +  mid-epigastric and LUQ tenderness to palp. Nondistended, Normal bowel sounds. Rectal exam performed w/permission of pt and ED RN chaperone present.  Anal tone normal.  Non-tender, soft maroon stool in rectal vault, heme positive.  No fissures, no external hemorrhoids, no palp masses.; Genitourinary: No CVA tenderness;  Extremities: Pulses normal, No tenderness, No edema, No calf edema or asymmetry.; Neuro: AA&Ox3, Major CN grossly intact.  Speech clear. No gross focal motor or sensory deficits in extremities.; Skin: Color pale, Warm, Dry.   ED Course  Procedures     EKG Interpretation   Date/Time:  Sunday May 04 2014 10:46:10 EDT Ventricular Rate:  106 PR Interval:  129 QRS Duration: 106 QT Interval:  349 QTC Calculation: 463 R Axis:   71 Text Interpretation:  Sinus tachycardia RSR' in V1 or V2, right VCD or RVH  Nonspecific T abnormalities, lateral leads No old tracing to compare  Confirmed by [facility]  MD, Nicholos Johns [phone]) on 05/04/2014 12:53:43 PM      MDM  MDM Reviewed: previous chart, nursing note and vitals Reviewed previous: labs Interpretation: labs, ECG and x-ray Total time providing critical care: 30-74 minutes. This excludes time spent performing separately reportable procedures and services. Consults: gastrointestinal and admitting MD    CRITICAL CARE Performed by: Laray Anger Total critical care time: 40 Critical care time was exclusive of separately billable procedures and treating other patients. Critical care was necessary to treat or prevent imminent or life-threatening deterioration. Critical care was time spent personally by me on the following activities: development of treatment plan with patient and/or surrogate as well as nursing, discussions with consultants, evaluation of patient's response to treatment, examination of patient, obtaining history from patient or surrogate, ordering and performing treatments and interventions, ordering and review of laboratory studies, ordering and review of radiographic studies, pulse oximetry and re-evaluation of patient's condition.  Results for orders placed during the hospital encounter of 05/04/14  CBC WITH DIFFERENTIAL      Result Value Ref Range   WBC 13.4 (*) 4.0 - 10.5 K/uL   RBC 1.41 (*) 4.22 - 5.81 MIL/uL    Hemoglobin 4.5 (*) 13.0 - 17.0 g/dL   HCT 60.4 (*) 54.0 - 98.1 %   MCV 90.8  78.0 - 100.0 fL   MCH 31.9  26.0 - 34.0 pg   MCHC 35.2  30.0 - 36.0 g/dL   RDW 19.1  47.8 - 29.5 %   Platelets 221  150 - 400 K/uL   Neutrophils Relative % 58  43 - 77 %   Neutro Abs 7.7  1.7 - 7.7 K/uL   Lymphocytes Relative 35  12 - 46 %   Lymphs Abs 4.7 (*) 0.7 - 4.0 K/uL   Monocytes Relative 7  3 - 12 %   Monocytes Absolute 0.9  0.1 - 1.0 K/uL   Eosinophils Relative 0  0 - 5 %   Eosinophils Absolute 0.0  0.0 - 0.7 K/uL   Basophils Relative 0  0 - 1 %   Basophils Absolute 0.0  0.0 - 0.1 K/uL  COMPREHENSIVE METABOLIC PANEL      Result Value Ref Range   Sodium 135 (*) 137 - 147 mEq/L   Potassium 3.6 (*) 3.7 - 5.3 mEq/L   Chloride 102  96 - 112 mEq/L   CO2 23  19 - 32 mEq/L   Glucose, Bld 148 (*) 70 - 99 mg/dL   BUN 22  6 - 23 mg/dL   Creatinine, Ser 6.21  0.50 -  1.35 mg/dL   Calcium 8.0 (*) 8.4 - 10.5 mg/dL   Total Protein 5.1 (*) 6.0 - 8.3 g/dL   Albumin 2.7 (*) 3.5 - 5.2 g/dL   AST 10  0 - 37 U/L   ALT 11  0 - 53 U/L   Alkaline Phosphatase 32 (*) 39 - 117 U/L   Total Bilirubin 0.1 (*) 0.3 - 1.2 mg/dL   GFR calc non Af Amer >90  >90 mL/min   GFR calc Af Amer >90  >90 mL/min   Anion gap 10  5 - 15  LIPASE, BLOOD      Result Value Ref Range   Lipase 50  11 - 59 U/L  ETHANOL      Result Value Ref Range   Alcohol, Ethyl (B) <11  0 - 11 mg/dL  PROTIME-INR      Result Value Ref Range   Prothrombin Time 14.5  11.6 - 15.2 seconds   INR 1.13  0.00 - 1.49  TYPE AND SCREEN      Result Value Ref Range   ABO/RH(D) A POS     Antibody Screen POS     Sample Expiration 05/07/2014    PREPARE RBC (CROSSMATCH)      Result Value Ref Range   Order Confirmation ORDER PROCESSED BY BLOOD BANK     Dg Abd Acute W/chest 05/04/2014   CLINICAL DATA:  Rectal bleeding, nausea and vomiting  EXAM: ACUTE ABDOMEN SERIES (ABDOMEN 2 VIEW & CHEST 1 VIEW)  COMPARISON:  None.  FINDINGS: There is no evidence of dilated bowel  loops or free intraperitoneal air. No radiopaque calculi or other significant radiographic abnormality is seen. Heart size and mediastinal contours are within normal limits. Both lungs are clear.  IMPRESSION: Negative abdominal radiographs.  No acute cardiopulmonary disease.   Electronically Signed   By: Christiana PellantGretchen  Green M.D.   On: 05/04/2014 11:55    1100:  Hgb lower than previous. Pt has hx upper GIB due to ulcer in 2012. Will start IV protonix bolus and gtt. IVF bolus given for SBP 90 with SBP increasing to 110. T/C to GI Dr. Karilyn Cotaehman, case discussed, including:  HPI, pertinent PM/SHx, VS/PE, dx testing, ED course and treatment:  Agreeable to consult, requests to admit to medicine service.   1145:  [facility] ED records requested. PRBC transfusion ordered.  T/C to [facility] Dr. Kerry HoughMemon, case discussed, including:  HPI, pertinent PM/SHx, VS/PE, dx testing, ED course and treatment:  Agreeable to admit, requests to write temporary orders, obtain stepdown bed to team 2.     Samuel JesterKathleen Clora Ohmer, DO 05/06/14 1807

## 2014-05-04 NOTE — Progress Notes (Signed)
Dr Kerry HoughMemon aware.

## 2014-05-05 ENCOUNTER — Inpatient Hospital Stay (HOSPITAL_COMMUNITY): Payer: Medicaid Other | Admitting: Anesthesiology

## 2014-05-05 ENCOUNTER — Encounter (HOSPITAL_COMMUNITY): Payer: Self-pay | Admitting: *Deleted

## 2014-05-05 ENCOUNTER — Encounter (HOSPITAL_COMMUNITY)
Admission: EM | Disposition: A | Discharge status: Home / Self Care | Payer: Self-pay | Source: Home / Self Care | Attending: Internal Medicine

## 2014-05-05 ENCOUNTER — Encounter (HOSPITAL_COMMUNITY): Payer: Medicaid Other | Admitting: Anesthesiology

## 2014-05-05 DIAGNOSIS — K279 Peptic ulcer, site unspecified, unspecified as acute or chronic, without hemorrhage or perforation: Secondary | ICD-10-CM

## 2014-05-05 DIAGNOSIS — K2960 Other gastritis without bleeding: Secondary | ICD-10-CM

## 2014-05-05 HISTORY — PX: ESOPHAGOGASTRODUODENOSCOPY (EGD) WITH PROPOFOL: SHX5813

## 2014-05-05 LAB — COMPREHENSIVE METABOLIC PANEL
ALBUMIN: 2.8 g/dL — AB (ref 3.5–5.2)
ALT: 11 U/L (ref 0–53)
AST: 14 U/L (ref 0–37)
Alkaline Phosphatase: 34 U/L — ABNORMAL LOW (ref 39–117)
Anion gap: 6 (ref 5–15)
BILIRUBIN TOTAL: 0.9 mg/dL (ref 0.3–1.2)
BUN: 12 mg/dL (ref 6–23)
CALCIUM: 8.1 mg/dL — AB (ref 8.4–10.5)
CHLORIDE: 105 meq/L (ref 96–112)
CO2: 26 mEq/L (ref 19–32)
CREATININE: 0.82 mg/dL (ref 0.50–1.35)
GFR calc Af Amer: >90 mL/min (ref >90)
GFR calc non Af Amer: >90 mL/min (ref >90)
Glucose, Bld: 106 mg/dL — ABNORMAL HIGH (ref 70–99)
Potassium: 3.7 mEq/L (ref 3.7–5.3)
Sodium: 137 mEq/L (ref 137–147)
TOTAL PROTEIN: 5.3 g/dL — AB (ref 6.0–8.3)

## 2014-05-05 LAB — CBC
HCT: 23.5 % — ABNORMAL LOW (ref 39.0–52.0)
HEMATOCRIT: 17.1 % — AB (ref 39.0–52.0)
HEMOGLOBIN: 6.1 g/dL — AB (ref 13.0–17.0)
Hemoglobin: 8.4 g/dL — ABNORMAL LOW (ref 13.0–17.0)
MCH: 31.2 pg (ref 26.0–34.0)
MCH: 32.3 pg (ref 26.0–34.0)
MCHC: 35.7 g/dL (ref 30.0–36.0)
MCHC: 35.7 g/dL (ref 30.0–36.0)
MCV: 87.4 fL (ref 78.0–100.0)
MCV: 90.5 fL (ref 78.0–100.0)
PLATELETS: 141 10*3/uL — AB (ref 150–400)
Platelets: 145 10*3/uL — ABNORMAL LOW (ref 150–400)
RBC: 1.89 MIL/uL — ABNORMAL LOW (ref 4.22–5.81)
RBC: 2.69 MIL/uL — ABNORMAL LOW (ref 4.22–5.81)
RDW: 13.8 % (ref 11.5–15.5)
RDW: 14.7 % (ref 11.5–15.5)
WBC: 10.5 10*3/uL (ref 4.0–10.5)
WBC: 8.5 10*3/uL (ref 4.0–10.5)

## 2014-05-05 LAB — OCCULT BLOOD, POC DEVICE: Fecal Occult Bld: POSITIVE — AB

## 2014-05-05 LAB — PREPARE RBC (CROSSMATCH)

## 2014-05-05 SURGERY — ESOPHAGOGASTRODUODENOSCOPY (EGD) WITH PROPOFOL
Anesthesia: Monitor Anesthesia Care | Site: Esophagus

## 2014-05-05 MED ORDER — LIDOCAINE HCL (CARDIAC) 10 MG/ML IV SOLN
INTRAVENOUS | Status: DC | PRN
  Administered 2014-05-05: 10 mg via INTRAVENOUS

## 2014-05-05 MED ORDER — SUCCINYLCHOLINE CHLORIDE 20 MG/ML IJ SOLN
INTRAMUSCULAR | Status: AC
  Filled 2014-05-05: qty 2

## 2014-05-05 MED ORDER — SODIUM CHLORIDE 0.9 % IV SOLN: Freq: Once | INTRAVENOUS | Status: DC

## 2014-05-05 MED ORDER — FENTANYL CITRATE 0.05 MG/ML IJ SOLN
INTRAMUSCULAR | Status: DC | PRN
  Administered 2014-05-05 (×2): 50 ug via INTRAVENOUS

## 2014-05-05 MED ORDER — DIPHENHYDRAMINE HCL 25 MG PO CAPS
25.0000 mg | ORAL_CAPSULE | Freq: Once | ORAL | Status: AC
  Administered 2014-05-05: 25 mg via ORAL
  Filled 2014-05-05: qty 1

## 2014-05-05 MED ORDER — SUCRALFATE 1 GM/10ML PO SUSP
1.0000 g | Freq: Three times a day (TID) | ORAL | Status: DC
  Administered 2014-05-05 – 2014-05-07 (×7): 1 g via ORAL
  Filled 2014-05-05 (×7): qty 10

## 2014-05-05 MED ORDER — FUROSEMIDE 10 MG/ML IJ SOLN
20.0000 mg | Freq: Once | INTRAMUSCULAR | Status: AC
  Administered 2014-05-05: 20 mg via INTRAVENOUS
  Filled 2014-05-05: qty 2

## 2014-05-05 MED ORDER — ACETAMINOPHEN 325 MG PO TABS
650.0000 mg | ORAL_TABLET | Freq: Once | ORAL | Status: AC
  Administered 2014-05-05: 650 mg via ORAL
  Filled 2014-05-05: qty 2

## 2014-05-05 MED ORDER — MIDAZOLAM HCL 2 MG/2ML IJ SOLN
INTRAMUSCULAR | Status: AC
  Filled 2014-05-05: qty 2

## 2014-05-05 MED ORDER — PROPOFOL INFUSION 10 MG/ML OPTIME
INTRAVENOUS | Status: DC | PRN
  Administered 2014-05-05: 100 ug/kg/min via INTRAVENOUS

## 2014-05-05 MED ORDER — PROPOFOL 10 MG/ML IV EMUL
INTRAVENOUS | Status: AC
  Filled 2014-05-05: qty 20

## 2014-05-05 MED ORDER — MIDAZOLAM HCL 5 MG/5ML IJ SOLN
INTRAMUSCULAR | Status: DC | PRN
  Administered 2014-05-05: 2 mg via INTRAVENOUS

## 2014-05-05 MED ORDER — MORPHINE SULFATE 4 MG/ML IJ SOLN
4.0000 mg | Freq: Once | INTRAMUSCULAR | Status: AC
Start: 2014-05-05 — End: 2014-05-06
  Administered 2014-05-06: 2 mg via INTRAVENOUS
  Filled 2014-05-05: qty 1

## 2014-05-05 MED ORDER — LIDOCAINE HCL (PF) 1 % IJ SOLN
INTRAMUSCULAR | Status: AC
  Filled 2014-05-05: qty 5

## 2014-05-05 MED ORDER — LACTATED RINGERS IV SOLN
INTRAVENOUS | Status: DC
  Administered 2014-05-05: 12:00:00 via INTRAVENOUS

## 2014-05-05 MED ORDER — PROPOFOL 10 MG/ML IV BOLUS
INTRAVENOUS | Status: DC | PRN
  Administered 2014-05-05 (×2): 8.6 mg via INTRAVENOUS

## 2014-05-05 MED ORDER — POLYVINYL ALCOHOL 1.4 % OP SOLN
1.0000 [drp] | OPHTHALMIC | Status: DC | PRN
  Filled 2014-05-05: qty 15

## 2014-05-05 MED ORDER — STERILE WATER FOR IRRIGATION IR SOLN
Status: DC | PRN
  Administered 2014-05-05: 13:00:00

## 2014-05-05 MED ORDER — BUTAMBEN-TETRACAINE-BENZOCAINE 2-2-14 % EX AERO
1.0000 | INHALATION_SPRAY | Freq: Once | CUTANEOUS | Status: AC
  Administered 2014-05-05: 1 via TOPICAL

## 2014-05-05 MED ORDER — FENTANYL CITRATE 0.05 MG/ML IJ SOLN
INTRAMUSCULAR | Status: AC
  Filled 2014-05-05: qty 2

## 2014-05-05 SURGICAL SUPPLY — 8 items
BLOCK BITE 60FR ADLT L/F BLUE (MISCELLANEOUS) ×2 IMPLANT
FLOOR PAD 36X40 (MISCELLANEOUS) ×3
KIT CLEAN ENDO COMPLIANCE (KITS) ×2 IMPLANT
MANIFOLD NEPTUNE II (INSTRUMENTS) ×3 IMPLANT
PAD FLOOR 36X40 (MISCELLANEOUS) IMPLANT
SYR 50ML LL SCALE MARK (SYRINGE) ×2 IMPLANT
TUBING IRRIGATION ENDOGATOR (MISCELLANEOUS) ×3 IMPLANT
WATER STERILE IRR 1000ML POUR (IV SOLUTION) ×2 IMPLANT

## 2014-05-05 NOTE — Progress Notes (Signed)
  Subjective:  Patient complains of mild midepigastric pain. He is hungry. She denies nausea vomiting. Last bowel movement was 2 days ago. He has not had any BM since hospitalization.   Objective: Blood pressure 120/69, pulse 63, temperature 97.9 F (36.6 C), temperature source Oral, resp. rate 14, height 5\' 8"  (1.727 m), weight 190 lb 4.1 oz (86.3 kg), SpO2 100.00%. Patient is alert and appears to be comfortable. Condyle is pale. Sclera nonicteric. Abdomen is full. It is soft with moderate epigastric tenderness on deep palpation. No organomegaly or masses. No LE edema or clubbing noted.  Urine output 1750 mL last two shifts.  Labs/studies Results:   Recent Labs  05/04/14 1039 05/05/14 0002  WBC 13.4* 10.5  HGB 4.5* 6.1*  HCT 12.8* 17.1*  PLT 221 145*    BMET   Recent Labs  05/04/14 1039  NA 135*  K 3.6*  CL 102  CO2 23  GLUCOSE 148*  BUN 22  CREATININE 0.84  CALCIUM 8.0*    LFT   Recent Labs  05/04/14 1039  PROT 5.1*  ALBUMIN 2.7*  AST 10  ALT 11  ALKPHOS 32*  BILITOT 0.1*    PT/INR   Recent Labs  05/04/14 1039  LABPROT 14.5  INR 1.13      Assessment:  #1. Acute GI bleed possibly from upper GI tract. Patient has received 4 units of PRBCs. Admission hemoglobin was 4.5 g  and 6.1 g after 2 units. It appears he is not actively bleeding. Patient appears to be well hydrated. He's had satisfactory urine output in the last 16 hours.  #2. alcohol abuse.   Recommendations;  EGD either today or tomorrow when cleared by anesthesia for propofol. In the meantime will continue pantoprazole infusion.

## 2014-05-05 NOTE — Progress Notes (Signed)
PRBCs completed with no complications or complaints from patient.

## 2014-05-05 NOTE — Progress Notes (Signed)
UR review completed. 

## 2014-05-05 NOTE — Transfer of Care (Signed)
Immediate Anesthesia Transfer of Care Note  Patient: Calvin PeerDavid Collingsworth  Procedure(s) Performed: Procedure(s) (LRB): ESOPHAGOGASTRODUODENOSCOPY (EGD) WITH PROPOFOL (N/A)  Patient Location: PACU  Anesthesia Type: MAC  Level of Consciousness: awake  Airway & Oxygen Therapy: Patient Spontanous Breathing.   Post-op Assessment: Report given to PACU RN, Post -op Vital signs reviewed and stable and Patient moving all extremities  Post vital signs: Reviewed and stable  Complications: No apparent anesthesia complications

## 2014-05-05 NOTE — Anesthesia Postprocedure Evaluation (Signed)
  Anesthesia Post-op Note  Patient: Calvin Carter  Procedure(s) Performed: Procedure(s): ESOPHAGOGASTRODUODENOSCOPY (EGD) WITH PROPOFOL (N/A)  Patient Location: PACU  Anesthesia Type:MAC  Level of Consciousness: sedated and patient cooperative  Airway and Oxygen Therapy: Patient Spontanous Breathing  Post-op Pain: none  Post-op Assessment: Post-op Vital signs reviewed, Patient's Cardiovascular Status Stable, Respiratory Function Stable, Patent Airway and Pain level controlled  Post-op Vital Signs: Reviewed and stable  Last Vitals:  Filed Vitals:   05/05/14 1330  BP: 115/60  Pulse: 65  Temp:   Resp: 16    Complications: No apparent anesthesia complications

## 2014-05-05 NOTE — Progress Notes (Signed)
[facility] PROGRESS NOTE  Calvin Carter ZOX:[id number] DOB: 1981/08/16 DOA: 05/04/2014 PCP: No PCP Per Patient  Assessment/Plan: 1. GI bleeding. Patient has not had any further bowel movements since being admitted to the hospital. Hemoglobin appears to be improving with transfusion. He is on a Protonix infusion. Plans are for endoscopy today. 2. Acute blood loss anemia. Patient is being transfused a total of 4 units PRBCs. Followup CBC is currently pending. We'll type and cross another 4 units in preparation for endoscopy.  3. History of alcohol abuse. No signs of withdrawal at present. Currently on alcohol withdrawal protocol with ativan  Code Status: Full code Family Communication: Discussed with patient and family member who interprets for the patient Disposition Plan: Discharge home once improved   Consultants:  Gastroenterology  Procedures:    Antibiotics:    HPI/Subjective: Feeling much better. He has not had any bowel movements.  Objective: Filed Vitals:   05/05/14 0830  BP: 120/69  Pulse: 63  Temp:   Resp: 14    Intake/Output Summary (Last 24 hours) at 05/05/14 0951 Last data filed at 05/05/14 8119  Gross per 24 hour  Intake 2999.17 ml  Output   2350 ml  Net 649.17 ml   Filed Weights   05/04/14 1035 05/04/14 1500  Weight: 88.451 kg (195 lb) 86.3 kg (190 lb 4.1 oz)    Exam:   General:  No acute distress  Cardiovascular:  S1, S2, regular rate and rhythm  Respiratory: Clear to auscultation bilaterally  Abdomen: Soft, nontender, positive bowel sounds  Musculoskeletal: No pedal edema bilaterally   Data Reviewed: Basic Metabolic Panel:  Recent Labs Lab 05/04/14 1039  NA 135*  K 3.6*  CL 102  CO2 23  GLUCOSE 148*  BUN 22  CREATININE 0.84  CALCIUM 8.0*   Liver Function Tests:  Recent Labs Lab 05/04/14 1039  AST 10  ALT 11  ALKPHOS 32*  BILITOT 0.1*  PROT 5.1*  ALBUMIN 2.7*    Recent Labs Lab 05/04/14 1039  LIPASE 50    No results found for this basename: AMMONIA,  in the last 168 hours CBC:  Recent Labs Lab 05/04/14 1039 05/05/14 0002  WBC 13.4* 10.5  NEUTROABS 7.7  --   HGB 4.5* 6.1*  HCT 12.8* 17.1*  MCV 90.8 90.5  PLT 221 145*   Cardiac Enzymes: No results found for this basename: CKTOTAL, CKMB, CKMBINDEX, TROPONINI,  in the last 168 hours BNP (last 3 results) No results found for this basename: PROBNP,  in the last 8760 hours CBG: No results found for this basename: GLUCAP,  in the last 168 hours  Recent Results (from the past 240 hour(s))  MRSA PCR SCREENING     Status: None   Collection Time    05/04/14  2:08 PM      Result Value Ref Range Status   MRSA by PCR NEGATIVE  NEGATIVE Final   Comment:            The GeneXpert MRSA Assay (FDA     approved for NASAL specimens     only), is one component of a     comprehensive MRSA colonization     surveillance program. It is not     intended to diagnose MRSA     infection nor to guide or     monitor treatment for     MRSA infections.     Studies: Dg Abd Acute W/chest  05/04/2014   CLINICAL DATA:  Rectal bleeding, nausea and  vomiting  EXAM: ACUTE ABDOMEN SERIES (ABDOMEN 2 VIEW & CHEST 1 VIEW)  COMPARISON:  None.  FINDINGS: There is no evidence of dilated bowel loops or free intraperitoneal air. No radiopaque calculi or other significant radiographic abnormality is seen. Heart size and mediastinal contours are within normal limits. Both lungs are clear.  IMPRESSION: Negative abdominal radiographs.  No acute cardiopulmonary disease.   Electronically Signed   By: Christiana PellantGretchen  Green M.D.   On: 05/04/2014 11:55    Scheduled Meds: . sodium chloride   Intravenous Once  . sodium chloride   Intravenous Once  . folic acid  1 mg Intravenous Daily  . thiamine  100 mg Intravenous Daily   Continuous Infusions: . sodium chloride    . pantoprozole (PROTONIX) infusion 8 mg/hr (05/04/14 1900)    Active Problems:   GI bleeding   Acute blood loss  anemia   GI bleed   Alcohol abuse    Time spent: 25mins    [facility],Calvin Carter  [facility] Pager [phone]. If 7PM-7AM, please contact night-coverage at [email or web address], password [name] 05/05/2014, 9:51 AM  LOS: 1 day

## 2014-05-05 NOTE — Anesthesia Procedure Notes (Signed)
Procedure Name: MAC Date/Time: 05/05/2014 12:55 PM Performed by: Franco NonesYATES, Genaro Bekker S Pre-anesthesia Checklist: Patient identified, Emergency Drugs available, Suction available, Timeout performed and Patient being monitored Patient Re-evaluated:Patient Re-evaluated prior to inductionOxygen Delivery Method: Non-rebreather mask

## 2014-05-05 NOTE — Op Note (Addendum)
EGD PROCEDURE REPORT  PATIENT:  Calvin PeerDavid Hofstra  MR#:  [id number] Birthdate:  Feb 03, 1981, 33 y.o., male Endoscopist:  Dr. Malissa HippoNajeeb U. Rehman, MD Referred By:  Dr. Erick BlinksJehanzeb [name], MD Procedure Date: 05/05/2014  Procedure:   EGD  Indications:  Patient is 33 year-old Hispanic male who presents with profound anemia and maroon stools per rectum. He has history of peptic ulcer disease with GI bleeding back in 2003. He was H. pylori positive and he possibly has been treated(not yet confirmed). He is suspected to be bleeding from upper GI tract.  Patient has received 4 units of PRBCs and his hemoglobin has come up from 4.5 g to 8.4 g.           Informed Consent:  The risks, benefits, alternatives & imponderables which include, but are not limited to, bleeding, infection, perforation, drug reaction and potential missed lesion have been reviewed.  The potential for biopsy, lesion removal, esophageal dilation, etc. have also been discussed.  Questions have been answered.  All parties agreeable.  Please see history & physical in medical record for more information.  Medications:  Cetacaine spray topically for oropharyngeal anesthesia Monitored anesthesia care; please see anesthesia records for details.  Description of procedure:  The endoscope was introduced through the mouth and advanced to the second portion of the duodenum without difficulty or limitations. The mucosal surfaces were surveyed very carefully during advancement of the scope and upon withdrawal.  Findings:  Esophagus:  Mucosa of the esophagus was normal. GE junction was unremarkable. GEJ:  39 cm Stomach:  Stomach was empty and distended very well with insufflation. Folds in the proximal stomach were normal. Examination of mucosa at gastric body was normal. 3 mm prepyloric ulcer was noted with a clean base. Pyloric channel was patent. Angularis fundus and cardia were unremarkable. 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 Mucosae and stricture was friable but no active bleeding noted. Post bulbar mucosa was normal.  Therapeutic/Diagnostic Maneuvers Performed:  None  Complications:  None  Impression: Small prepyloric ulcer with clean base. 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 No evidence of portal gastropathy.   Recommendations:  Continue pantoprazole infusion for another 24 hours. Sucralfate 1 g by mouth a.c. and each bedtime. Will treat or retreat for H. pylori infection on an outpatient basis.  REHMAN,NAJEEB U  05/05/2014  1:24 PM  CC: Dr. Bonnetta BarryNo PCP Per Patient & Dr. No ref. provider found

## 2014-05-05 NOTE — Anesthesia Preprocedure Evaluation (Addendum)
Anesthesia Evaluation  Patient identified by MRN, date of birth, ID band Patient awake    Reviewed: Allergy & Precautions, H&P , NPO status , Patient's Chart, lab work & pertinent test results  Airway Mallampati: II TM Distance: >3 FB Neck ROM: Full    Dental  (+) Teeth Intact, Dental Advisory Given   Pulmonary Current Smoker,  2-3 per day   Pulmonary exam normal       Cardiovascular Exercise Tolerance: Good Rhythm:Regular Rate:Normal     Neuro/Psych    GI/Hepatic PUD,   Endo/Other    Renal/GU      Musculoskeletal   Abdominal   Peds  Hematology  (+) anemia ,   Anesthesia Other Findings Alcohol(weekend drinking 2-3 cases. Sometimes more)  Reproductive/Obstetrics                         Anesthesia Physical Anesthesia Plan  ASA: II  Anesthesia Plan: MAC   Post-op Pain Management:    Induction:   Airway Management Planned: Simple Face Mask  Additional Equipment:   Intra-op Plan:   Post-operative Plan:   Informed Consent:   Dental advisory given (limited English; history obtained from chart and from family with patient verification)  Plan Discussed with: Anesthesiologist and Surgeon  Anesthesia Plan Comments: ([facility] )      Anesthesia Quick Evaluation

## 2014-05-05 NOTE — OR Nursing (Signed)
Dr. Karilyn Cotaehman notified of  Antibody in blood, He stated he was aware of that.  Also aware that Dr. Jayme CloudGonzalez would like 4 - units PRBC's available  for the surgery.  At this time 4th unit is still infusing , then will see what Hgb and Hct level is.

## 2014-05-05 NOTE — H&P (Signed)
[facility] History and Physical  Leul Narramore QMV:[id number] DOB: 03/01/1981 DOA: 05/04/2014  Referring physician: Dr. Clarene Duke, ER physician PCP: No PCP Per Patient   Chief Complaint: Frequent maroon colored stools.  HPI: Calvin Carter is a 33 y.o. male who presents to the emergency room today with complaints of multiple episodes of maroon-colored stools. He feels generally weak. Denies any NSAID use. He continues to drink alcohol regularly, mostly on weekends. He reportedly drinks 3 cases of beer over the weekend. His last bowel movement was yesterday. History is somewhat limited since patient is not speaking. His family is interpreting for him. However for the emergency room, hemoglobin was found to be markedly elevated at 4.5. He date admitted to the ICU for further management   Review of Systems:  Pertinent positives as per history of present illness, otherwise negative   Past Medical History  Diagnosis Date  . Alcohol abuse   . Stomach ulcer 2003  . GI bleeding 2003  . Medical history non-contributory    Past Surgical History  Procedure Laterality Date  . No past surgeries     Social History:  reports that he has been smoking Cigarettes.  He has a .5 pack-year smoking history. He does not have any smokeless tobacco history on file. He reports that he drinks alcohol. He reports that he uses illicit drugs (Cocaine) about once per week.  No Known Allergies  History reviewed. No pertinent family history.   Prior to Admission medications   Medication Sig Start Date End Date Taking? Authorizing Provider  acetaminophen (TYLENOL) 500 MG tablet Take 1,000 mg by mouth every 6 (six) hours as needed for moderate pain.   Yes Historical Provider, MD  hydroxypropyl methylcellulose (ISOPTO TEARS) 2.5 % ophthalmic solution Place 1 drop into both eyes as needed for dry eyes.   Yes Historical Provider, MD  promethazine (PHENERGAN) 25 MG tablet Take 25 mg by mouth every 6 (six) hours as  needed for nausea or vomiting.   Yes Historical Provider, MD   Physical Exam: Filed Vitals:   05/05/14 1600 05/05/14 1651 05/05/14 1700 05/05/14 1800  BP: 119/67  128/65 124/66  Pulse: 59  72 63  Temp:  97.8 F (36.6 C)    TempSrc:  Oral    Resp: 11  14 13   Height:      Weight:      SpO2: 100%  100% 100%    Wt Readings from Last 3 Encounters:  05/04/14 86.3 kg (190 lb 4.1 oz)  05/04/14 86.3 kg (190 lb 4.1 oz)    General:  The patient lying in bed, opens eyes to voice, does not appear to be in any distress Eyes: PERRL, normal lids, irises & conjunctiva ENT: Mucous membranes are pale Neck: no LAD, masses or thyromegaly Cardiovascular: S1, S2, tachycardic, no m/r/g. No LE edema. Telemetry: SR, no arrhythmias  Respiratory: CTA bilaterally, no w/r/r. Normal respiratory effort. Abdomen: soft, ntnd, positive bowel sounds Skin: no rash or induration seen on limited exam Musculoskeletal: grossly normal tone BUE/BLE Psychiatric: grossly normal mood and affect, speech fluent and appropriate Neurologic: grossly non-focal.          Labs on Admission:  Basic Metabolic Panel:  Recent Labs Lab 05/04/14 1039 05/05/14 1007  NA 135* 137  K 3.6* 3.7  CL 102 105  CO2 23 26  GLUCOSE 148* 106*  BUN 22 12  CREATININE 0.84 0.82  CALCIUM 8.0* 8.1*   Liver Function Tests:  Recent Labs Lab 05/04/14 1039 05/05/14  1007  AST 10 14  ALT 11 11  ALKPHOS 32* 34*  BILITOT 0.1* 0.9  PROT 5.1* 5.3*  ALBUMIN 2.7* 2.8*    Recent Labs Lab 05/04/14 1039  LIPASE 50   No results found for this basename: AMMONIA,  in the last 168 hours CBC:  Recent Labs Lab 05/04/14 1039 05/05/14 0002 05/05/14 1007  WBC 13.4* 10.5 8.5  NEUTROABS 7.7  --   --   HGB 4.5* 6.1* 8.4*  HCT 12.8* 17.1* 23.5*  MCV 90.8 90.5 87.4  PLT 221 145* 141*   Cardiac Enzymes: No results found for this basename: CKTOTAL, CKMB, CKMBINDEX, TROPONINI,  in the last 168 hours  BNP (last 3 results) No results  found for this basename: PROBNP,  in the last 8760 hours CBG: No results found for this basename: GLUCAP,  in the last 168 hours  Radiological Exams on Admission: Dg Abd Acute W/chest  05/04/2014   CLINICAL DATA:  Rectal bleeding, nausea and vomiting  EXAM: ACUTE ABDOMEN SERIES (ABDOMEN 2 VIEW & CHEST 1 VIEW)  COMPARISON:  None.  FINDINGS: There is no evidence of dilated bowel loops or free intraperitoneal air. No radiopaque calculi or other significant radiographic abnormality is seen. Heart size and mediastinal contours are within normal limits. Both lungs are clear.  IMPRESSION: Negative abdominal radiographs.  No acute cardiopulmonary disease.   Electronically Signed   By: Christiana PellantGretchen  Green M.D.   On: 05/04/2014 11:55    Assessment/Plan Active Problems:   GI bleeding   Acute blood loss anemia   GI bleed   Alcohol abuse   1. GI bleeding. Patient's last maroon-colored stool was yesterday. Hopefully the bleeding has stopped. Case was discussed with Dr. Karilyn Cotaehman who plans on endoscopy in the morning. He is hemodynamically stable. He started on Protonix infusion. Continue to check serial CBCs. 2. Acute blood loss anemia. According to records, [facility]. At that time, she was noted to be greater than 11. It was felt that he may have a gastroenteritis patient was discharged. The patient has an antibody which has delayed obtaining blood from the blood bank. He was transfused 2 units of PRBCs right now, and likely another 2 units depending on response to initial transfusion. Check anemia panel 3. History of alcohol abuse. We'll start alcohol withdrawal protocol.  Code Status: full code DVT Prophylaxis: SCDs Family Communication: discussed with family at the bedside Disposition Plan: discharge home once improved  Time spent: 50mins  [facility] Massachusetts[facility],Alby Schwabe [facility] Pager [phone]  **Disclaimer: This note may have been dictated  with voice recognition software. Similar sounding words can inadvertently be transcribed and this note may contain transcription errors which may not have been corrected upon publication of note.**

## 2014-05-06 ENCOUNTER — Encounter (HOSPITAL_COMMUNITY): Payer: Self-pay | Admitting: Internal Medicine

## 2014-05-06 DIAGNOSIS — E876 Hypokalemia: Secondary | ICD-10-CM

## 2014-05-06 LAB — COMPREHENSIVE METABOLIC PANEL
ALT: 8 U/L (ref 0–53)
AST: 11 U/L (ref 0–37)
Albumin: 2.6 g/dL — ABNORMAL LOW (ref 3.5–5.2)
Alkaline Phosphatase: 35 U/L — ABNORMAL LOW (ref 39–117)
Anion gap: 8 (ref 5–15)
BUN: 10 mg/dL (ref 6–23)
CALCIUM: 8 mg/dL — AB (ref 8.4–10.5)
CO2: 26 meq/L (ref 19–32)
CREATININE: 0.8 mg/dL (ref 0.50–1.35)
Chloride: 104 mEq/L (ref 96–112)
GFR calc non Af Amer: >90 mL/min (ref >90)
GLUCOSE: 97 mg/dL (ref 70–99)
Potassium: 3.5 mEq/L — ABNORMAL LOW (ref 3.7–5.3)
SODIUM: 138 meq/L (ref 137–147)
TOTAL PROTEIN: 5 g/dL — AB (ref 6.0–8.3)
Total Bilirubin: 0.5 mg/dL (ref 0.3–1.2)

## 2014-05-06 LAB — CBC
HCT: 21.8 % — ABNORMAL LOW (ref 39.0–52.0)
HEMOGLOBIN: 7.6 g/dL — AB (ref 13.0–17.0)
MCH: 30.9 pg (ref 26.0–34.0)
MCHC: 34.9 g/dL (ref 30.0–36.0)
MCV: 88.6 fL (ref 78.0–100.0)
Platelets: 160 10*3/uL (ref 150–400)
RBC: 2.46 MIL/uL — AB (ref 4.22–5.81)
RDW: 15.1 % (ref 11.5–15.5)
WBC: 7 10*3/uL (ref 4.0–10.5)

## 2014-05-06 MED ORDER — POTASSIUM CHLORIDE CRYS ER 20 MEQ PO TBCR
40.0000 meq | EXTENDED_RELEASE_TABLET | Freq: Once | ORAL | Status: AC
  Administered 2014-05-06: 40 meq via ORAL
  Filled 2014-05-06: qty 2

## 2014-05-06 MED ORDER — PANTOPRAZOLE SODIUM 40 MG PO TBEC
40.0000 mg | DELAYED_RELEASE_TABLET | Freq: Two times a day (BID) | ORAL | Status: DC
  Administered 2014-05-06 – 2014-05-07 (×2): 40 mg via ORAL
  Filled 2014-05-06 (×2): qty 1

## 2014-05-06 MED ORDER — SODIUM CHLORIDE 0.9 % IV SOLN
Freq: Once | INTRAVENOUS | Status: AC
Start: 2014-05-06 — End: 2014-05-06
  Administered 2014-05-06: 10:00:00 via INTRAVENOUS

## 2014-05-06 NOTE — Progress Notes (Signed)
  Subjective: Patient has no complaints. He denies nausea vomiting abdominal pain. He hasn't had any bowel movements since admission.   Objective: Blood pressure 110/59, pulse 68, temperature 98.7 F (37.1 C), temperature source Oral, resp. rate 18, height 5\' 8"  (1.727 m), weight 203 lb 7.8 oz (92.3 kg), SpO2 100.00%. Patient is alert and in no acute distress. Conjunctiva is pale. Sclera is nonicteric Abdomen soft and nontender without organomegaly or masses.  No LE edema or clubbing noted.  Labs/studies Results:   Recent Labs  05/05/14 0002 05/05/14 1007 05/06/14 0419  WBC 10.5 8.5 7.0  HGB 6.1* 8.4* 7.6*  HCT 17.1* 23.5* 21.8*  PLT 145* 141* 160    BMET   Recent Labs  05/04/14 1039 05/05/14 1007 05/06/14 0419  NA 135* 137 138  K 3.6* 3.7 3.5*  CL 102 105 104  CO2 23 26 26   GLUCOSE 148* 106* 97  BUN 22 12 10   CREATININE 0.84 0.82 0.80  CALCIUM 8.0* 8.1* 8.0*    LFT   Recent Labs  05/04/14 1039 05/05/14 1007 05/06/14 0419  PROT 5.1* 5.3* 5.0*  ALBUMIN 2.7* 2.8* 2.6*  AST 10 14 11   ALT 11 11 8   ALKPHOS 32* 34* 35*  BILITOT 0.1* 0.9 0.5    PT/INR   Recent Labs  05/04/14 1039  LABPROT 14.5  INR 1.13      Assessment:  #1.Upper GI bleed secondary to duodenal ulcers. He also has small prepyloric ulcer. Patient has received another unit of PRBCs. No evidence of active bleeding. It is still not clear if he's been treated for H. pylori gastritis. #2.Alcohol abuse  without evidence of alcoholic hepatitis. Low serum albumin may be due to acute illness or malnutrition. He would benefit from rehabilitation.  Recommendations;  Advance diet. Change pantoprazole to oral route. Will also check serum gastrin level. Therapy for H. pylori gastritis on an outpatient basis. He should also undergo followup EGD in 10-12 weeks to document healing of ulcers.

## 2014-05-06 NOTE — Progress Notes (Signed)
PT TRANSFRERING TO ROOM 317 AS MED/SURG PT. TRANSFER REPORT CALLED TO ASHLEY RODGERS RN ON [address]. PT ALERT AND ORIENTED. NO FURTHER GI BLEEDING SINCE EGD YESTERDAY 8/3//15. 1UNIT PRBC TRANSFUSING AS ORDERED. VSS. DENIES ANY DISCOMFORT PER WIFE.WHO IS TRANSLATING HISPANIC TO ENGLISH. IV SITES X3 PATENT. VOIDING W/O DIFFICULTY.

## 2014-05-06 NOTE — Progress Notes (Signed)
[facility] PROGRESS NOTE  Calvin Carter ZOX:[id number] DOB: 01/02/1981 DOA: 05/04/2014 PCP: No PCP Per Patient  Assessment/Plan: 1. GI bleeding. Patient has not had any further bowel movements since being admitted to the hospital. Hemoglobin appears to be improving with transfusion. He is on a Protonix infusion. He underwent EGD with results noted as below.  Can likely change from protonix infusion to po protonix today.  Will likely need treatment for H pylori as an outpatient, which will be arranged by Dr. Patty Sermonsehman's office. 2. Acute blood loss anemia. Patient has been transfused a total of 4 units PRBCs since admission. Hemoglobin has improved.  Current hemoglobin is 7.6. Will transfuse one more unit prbc today.  3. History of alcohol abuse. No signs of withdrawal at present. Currently on alcohol withdrawal protocol with ativan 4. Hypokalemia, replace 5. Neck pain.  Likely musculoskeletal.  Code Status: Full code Family Communication: Discussed with patient and family member who interprets for the patient Disposition Plan: Discharge home once improved   Consultants:  Gastroenterology  Procedures: EGD: Small prepyloric ulcer with clean base.  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  No evidence of portal gastropathy.    Antibiotics:    HPI/Subjective: Complains of pain on right side of neck, worse with movement  Objective: Filed Vitals:   05/06/14 0800  BP: 108/64  Pulse: 63  Temp: 98.3 F (36.8 C)  Resp: 14    Intake/Output Summary (Last 24 hours) at 05/06/14 0930 Last data filed at 05/06/14 0800  Gross per 24 hour  Intake 2027.5 ml  Output    500 ml  Net 1527.5 ml   Filed Weights   05/04/14 1035 05/04/14 1500 05/06/14 0500  Weight: 88.451 kg (195 lb) 86.3 kg (190 lb 4.1 oz) 92.3 kg (203 lb 7.8 oz)    Exam:   General:  No acute distress  HEENT: tender at right occiput, worse while turning head to the  left  Cardiovascular:  S1, S2, regular rate and rhythm  Respiratory: Clear to auscultation bilaterally  Abdomen: Soft, nontender, positive bowel sounds  Musculoskeletal: No pedal edema bilaterally   Data Reviewed: Basic Metabolic Panel:  Recent Labs Lab 05/04/14 1039 05/05/14 1007 05/06/14 0419  NA 135* 137 138  K 3.6* 3.7 3.5*  CL 102 105 104  CO2 23 26 26   GLUCOSE 148* 106* 97  BUN 22 12 10   CREATININE 0.84 0.82 0.80  CALCIUM 8.0* 8.1* 8.0*   Liver Function Tests:  Recent Labs Lab 05/04/14 1039 05/05/14 1007 05/06/14 0419  AST 10 14 11   ALT 11 11 8   ALKPHOS 32* 34* 35*  BILITOT 0.1* 0.9 0.5  PROT 5.1* 5.3* 5.0*  ALBUMIN 2.7* 2.8* 2.6*    Recent Labs Lab 05/04/14 1039  LIPASE 50   No results found for this basename: AMMONIA,  in the last 168 hours CBC:  Recent Labs Lab 05/04/14 1039 05/05/14 0002 05/05/14 1007 05/06/14 0419  WBC 13.4* 10.5 8.5 7.0  NEUTROABS 7.7  --   --   --   HGB 4.5* 6.1* 8.4* 7.6*  HCT 12.8* 17.1* 23.5* 21.8*  MCV 90.8 90.5 87.4 88.6  PLT 221 145* 141* 160   Cardiac Enzymes: No results found for this basename: CKTOTAL, CKMB, CKMBINDEX, TROPONINI,  in the last 168 hours BNP (last 3 results) No results found for this basename: PROBNP,  in the last 8760 hours CBG: No results found for this basename: GLUCAP,  in the last 168 hours  Recent Results (from the past 240 hour(s))  MRSA PCR SCREENING     Status: None   Collection Time    05/04/14  2:08 PM      Result Value Ref Range Status   MRSA by PCR NEGATIVE  NEGATIVE Final   Comment:            The GeneXpert MRSA Assay (FDA     approved for NASAL specimens     only), is one component of a     comprehensive MRSA colonization     surveillance program. It is not     intended to diagnose MRSA     infection nor to guide or     monitor treatment for     MRSA infections.     Studies: Dg Abd Acute W/chest  05/04/2014   CLINICAL DATA:  Rectal bleeding, nausea and vomiting   EXAM: ACUTE ABDOMEN SERIES (ABDOMEN 2 VIEW & CHEST 1 VIEW)  COMPARISON:  None.  FINDINGS: There is no evidence of dilated bowel loops or free intraperitoneal air. No radiopaque calculi or other significant radiographic abnormality is seen. Heart size and mediastinal contours are within normal limits. Both lungs are clear.  IMPRESSION: Negative abdominal radiographs.  No acute cardiopulmonary disease.   Electronically Signed   By: Christiana Pellant M.D.   On: 05/04/2014 11:55    Scheduled Meds: . sodium chloride   Intravenous Once  . folic acid  1 mg Intravenous Daily  . potassium chloride  40 mEq Oral Once  . sucralfate  1 g Oral TID WC & HS  . thiamine  100 mg Intravenous Daily   Continuous Infusions: . pantoprozole (PROTONIX) infusion 8 mg/hr (05/06/14 0800)    Active Problems:   GI bleeding   Acute blood loss anemia   GI bleed   Alcohol abuse    Time spent:    [facility]  [facility] Pager [phone]. If 7PM-7AM, please contact night-coverage at [email or web address], password [facility] 05/06/2014, 9:30 AM  LOS: 2 days

## 2014-05-06 NOTE — Plan of Care (Signed)
Problem: Phase I Progression Outcomes Goal: Pain controlled with appropriate interventions Outcome: Progressing Cramping to Right side of head, not HA, but cramping.  Called md for pain management, when told patient medication to be given they decided to hold off that it had eased up. Goal: Initial discharge plan identified Outcome: Progressing Home with spouse

## 2014-05-07 DIAGNOSIS — D649 Anemia, unspecified: Secondary | ICD-10-CM

## 2014-05-07 LAB — CBC
HCT: 27.9 % — ABNORMAL LOW (ref 39.0–52.0)
Hemoglobin: 9.7 g/dL — ABNORMAL LOW (ref 13.0–17.0)
MCH: 31 pg (ref 26.0–34.0)
MCHC: 34.8 g/dL (ref 30.0–36.0)
MCV: 89.1 fL (ref 78.0–100.0)
PLATELETS: 205 10*3/uL (ref 150–400)
RBC: 3.13 MIL/uL — AB (ref 4.22–5.81)
RDW: 15.3 % (ref 11.5–15.5)
WBC: 5.4 10*3/uL (ref 4.0–10.5)

## 2014-05-07 LAB — BASIC METABOLIC PANEL
ANION GAP: 12 (ref 5–15)
BUN: 10 mg/dL (ref 6–23)
CALCIUM: 8.6 mg/dL (ref 8.4–10.5)
CO2: 25 meq/L (ref 19–32)
Chloride: 104 mEq/L (ref 96–112)
Creatinine, Ser: 0.81 mg/dL (ref 0.50–1.35)
GFR calc Af Amer: >90 mL/min (ref >90)
GFR calc non Af Amer: >90 mL/min (ref >90)
Glucose, Bld: 127 mg/dL — ABNORMAL HIGH (ref 70–99)
POTASSIUM: 3.7 meq/L (ref 3.7–5.3)
SODIUM: 141 meq/L (ref 137–147)

## 2014-05-07 MED ORDER — PANTOPRAZOLE SODIUM 40 MG PO TBEC: 40.0000 mg | DELAYED_RELEASE_TABLET | Freq: Two times a day (BID) | ORAL | Status: DC

## 2014-05-07 NOTE — Discharge Summary (Signed)
Physician Discharge Summary  Calvin Carter ZOX:[id number] DOB: [birth year] DOA: 05/04/2014  PCP: No PCP Per Patient  Admit date: 05/04/2014 Discharge date: 05/07/2014  Time spent: 35 minutes  Recommendations for Outpatient Follow-up:  Follow up with PCP in 1-2 weeks Follow up serum gastrin level Outpt tx for H.pylori on discharge - Dr. Karilyn Cotaehman to follow up in 2 weeks Plan for follow up EGD in 10-12 weeks per GI  Discharge Diagnoses:  Active Problems:   GI bleeding   Acute blood loss anemia   GI bleed   Alcohol abuse   Discharge Condition: Stable  Diet recommendation: Regular  Filed Weights   05/04/14 1035 05/04/14 1500 05/06/14 0500  Weight: 88.451 kg (195 lb) 86.3 kg (190 lb 4.1 oz) 92.3 kg (203 lb 7.8 oz)    History of present illness:  See admit h and p from 8/2 for details. Briefly, pt presents with maroon colored stools with hgb of 4.5 in the setting of etoh abuse. Pt was admitted to the floor for further work up.  Hospital Course:  1. GI bleeding.  1. Patient has not had any further bowel movements since being admitted to the hospital.  2. Hemoglobin improved with transfusion.  3. Was initially cont on a Protonix infusion.  4. Pt underwent EGD with results noted as below.  5. Protonix infusion was changed to po on 8/4.  6. GI recs for treatment for H pylori as an outpatient, which will be arranged by Dr. Patty Sermonsehman's office. 2. Acute blood loss anemia.  1. Patient has been transfused a total of 5 units PRBCs since admission. 2. Hemoglobin has improved.  3. Current hemoglobin is 9.7 on d/c 3. History of alcohol abuse. 1. No signs of withdrawal at present.  2. Pt remained on alcohol withdrawal protocol with ativan 4. Hypokalemia 1. replaced 5. Neck pain.  1. Likely musculoskeletal  Procedures: EGD: Small prepyloric ulcer with clean base.  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  No evidence of portal  gastropathy.  Consultations:  GI  Discharge Exam: Filed Vitals:   05/06/14 1154 05/06/14 1249 05/06/14 2209 05/07/14 0500  BP: 104/55 110/59 119/63 110/61  Pulse: 65 68 65 61  Temp: 98.6 F (37 C) 98.7 F (37.1 C) 97.9 F (36.6 C) 98 F (36.7 C)  TempSrc: Oral Oral Oral Oral  Resp: 18 18 20 20   Height: 5\' 8"  (1.727 m)     Weight:      SpO2: 97% 100% 98% 100%    General: Awake, in nad Cardiovascular: Regular, s1, s2 Respiratory: normal resp effort, no wheezing  Discharge Instructions     Medication List         acetaminophen 500 MG tablet  Commonly known as:  TYLENOL  Take 1,000 mg by mouth every 6 (six) hours as needed for moderate pain.     hydroxypropyl methylcellulose 2.5 % ophthalmic solution  Commonly known as:  ISOPTO TEARS  Place 1 drop into both eyes as needed for dry eyes.     pantoprazole 40 MG tablet  Commonly known as:  PROTONIX  Take 1 tablet (40 mg total) by mouth 2 (two) times daily before a meal.     promethazine 25 MG tablet  Commonly known as:  PHENERGAN  Take 25 mg by mouth every 6 (six) hours as needed for nausea or vomiting.       No Known Allergies Follow-up Information   Schedule an appointment as soon as possible for a  visit with Follow up wtih PCP in 1-2 weeks.      Follow up with [facility] On 05/28/2014. (at 3:00)    Specialty:  Occupational Therapy   Contact information:   [address] [place] [address] [phone]       Follow up with REHMAN,NAJEEB U, MD. Schedule an appointment as soon as possible for a visit in 2 weeks.   Specialty:  Gastroenterology   Contact information:   [address], [address] [place] [address] [phone]        The results of significant diagnostics from this hospitalization (including imaging, microbiology, ancillary and laboratory) are listed below for reference.    Significant Diagnostic Studies: Dg Abd Acute W/chest  05/04/2014   CLINICAL  DATA:  Rectal bleeding, nausea and vomiting  EXAM: ACUTE ABDOMEN SERIES (ABDOMEN 2 VIEW & CHEST 1 VIEW)  COMPARISON:  None.  FINDINGS: There is no evidence of dilated bowel loops or free intraperitoneal air. No radiopaque calculi or other significant radiographic abnormality is seen. Heart size and mediastinal contours are within normal limits. Both lungs are clear.  IMPRESSION: Negative abdominal radiographs.  No acute cardiopulmonary disease.   Electronically Signed   By: Christiana Pellant M.D.   On: 05/04/2014 11:55    Microbiology: Recent Results (from the past 240 hour(s))  MRSA PCR SCREENING     Status: None   Collection Time    05/04/14  2:08 PM      Result Value Ref Range Status   MRSA by PCR NEGATIVE  NEGATIVE Final   Comment:            The GeneXpert MRSA Assay (FDA     approved for NASAL specimens     only), is one component of a     comprehensive MRSA colonization     surveillance program. It is not     intended to diagnose MRSA     infection nor to guide or     monitor treatment for     MRSA infections.     Labs: Basic Metabolic Panel:  Recent Labs Lab 05/04/14 1039 05/05/14 1007 05/06/14 0419 05/07/14 0615  NA 135* 137 138 141  K 3.6* 3.7 3.5* 3.7  CL 102 105 104 104  CO2 23 26 26 25   GLUCOSE 148* 106* 97 127*  BUN 22 12 10 10   CREATININE 0.84 0.82 0.80 0.81  CALCIUM 8.0* 8.1* 8.0* 8.6   Liver Function Tests:  Recent Labs Lab 05/04/14 1039 05/05/14 1007 05/06/14 0419  AST 10 14 11   ALT 11 11 8   ALKPHOS 32* 34* 35*  BILITOT 0.1* 0.9 0.5  PROT 5.1* 5.3* 5.0*  ALBUMIN 2.7* 2.8* 2.6*    Recent Labs Lab 05/04/14 1039  LIPASE 50   No results found for this basename: AMMONIA,  in the last 168 hours CBC:  Recent Labs Lab 05/04/14 1039 05/05/14 0002 05/05/14 1007 05/06/14 0419 05/07/14 0615  WBC 13.4* 10.5 8.5 7.0 5.4  NEUTROABS 7.7  --   --   --   --   HGB 4.5* 6.1* 8.4* 7.6* 9.7*  HCT 12.8* 17.1* 23.5* 21.8* 27.9*  MCV 90.8 90.5 87.4 88.6  89.1  PLT 221 145* 141* 160 205   Cardiac Enzymes: No results found for this basename: CKTOTAL, CKMB, CKMBINDEX, TROPONINI,  in the last 168 hours BNP: BNP (last 3 results) No results found for this basename: PROBNP,  in the last 8760 hours  CBG: No results found for this basename: GLUCAP,  in the last 168 hours  Signed:  Brysan Mcevoy K  [facility] 05/07/2014, 12:29 PM

## 2014-05-07 NOTE — Progress Notes (Signed)
Patient discharged home with wife.  IVs removed - WNL.  Reviewed DC instructions with patient and wife - they verbalize understanding of new protonix Rx and advice to stop drinking alcohol.  Follow up appt in place with PCP and wife to make follow up appt with Rehman in 2 weeks.  No questions at this time.  Stable to DC home, pt ambulated off unit with RN assist.

## 2014-05-07 NOTE — Care Management Note (Signed)
    Page 1 of 1   05/07/2014     11:36:57 AM CARE MANAGEMENT NOTE 05/07/2014  Patient:  Calvin Carter,Calvin Carter   Account Number:  [id number]  Date Initiated:  05/07/2014  Documentation initiated by:  Sharrie RothmanBLACKWELL,Reid Nawrot C  Subjective/Objective Assessment:   Pt admitted from home with gi bleed. Pt lives with his wife and will return home at discharge. Pt is independent with ADl's.     Action/Plan:   [facility]. Encouraged pt to apply for Medicaid.   Anticipated DC Date:  05/07/2014   Anticipated DC Plan:  HOME/SELF CARE      DC Planning Services  CM consult  Follow-up appt scheduled      Choice offered to / List presented to:             Status of service:  Completed, signed off Medicare Important Message given?   (If response is "NO", the following Medicare IM given date fields will be blank) Date Medicare IM given:   Medicare IM given by:   Date Additional Medicare IM given:   Additional Medicare IM given by:    Discharge Disposition:  HOME/SELF CARE  Per UR Regulation:    If discussed at Long Length of Stay Meetings, dates discussed:    Comments:  05/07/14 1135 Arlyss Queenammy Ashraf Mesta, RN BSN CM

## 2014-05-08 LAB — TYPE AND SCREEN
ABO/RH(D): A POS
ANTIBODY SCREEN: POSITIVE
DAT, IgG: NEGATIVE
DONOR AG TYPE: NEGATIVE
DONOR AG TYPE: NEGATIVE
DONOR AG TYPE: NEGATIVE
Donor AG Type: NEGATIVE
Donor AG Type: NEGATIVE
Donor AG Type: NEGATIVE
Donor AG Type: NEGATIVE
Donor AG Type: NEGATIVE
Donor AG Type: NEGATIVE
Donor AG Type: NEGATIVE
PT AG TYPE: NEGATIVE
UNIT DIVISION: 0
UNIT DIVISION: 0
UNIT DIVISION: 0
UNIT DIVISION: 0
UNIT DIVISION: 0
Unit division: 0
Unit division: 0
Unit division: 0
Unit division: 0
Unit division: 0

## 2014-05-09 LAB — GASTRIN: GASTRIN: 365 pg/mL — AB (ref <=100)

## 2014-05-13 ENCOUNTER — Telehealth (INDEPENDENT_AMBULATORY_CARE_PROVIDER_SITE_OTHER): Payer: Self-pay | Admitting: *Deleted

## 2014-05-13 DIAGNOSIS — D62 Acute posthemorrhagic anemia: Secondary | ICD-10-CM

## 2014-05-13 NOTE — Telephone Encounter (Signed)
Per Dr.Rehman the patient will need to have labs drawn the week of August 10 with a office visit with Terri. Patient will need to have treatment for H- Pylori.

## 2014-05-15 NOTE — Telephone Encounter (Signed)
Called both number in chart and will ring 1 time then go to a Bz single. Will try again.

## 2014-05-16 NOTE — Telephone Encounter (Signed)
Returned a call to Etter SjogrenMaria Huerta stating she is the wife at [phone]. No answer

## 2014-05-19 NOTE — Telephone Encounter (Signed)
Apt has been scheduled for 05/21/14 with Dorene Arerri Setzer, NP.

## 2014-05-20 LAB — CBC
HEMATOCRIT: 33.5 % — AB (ref 39.0–52.0)
Hemoglobin: 11.3 g/dL — ABNORMAL LOW (ref 13.0–17.0)
MCH: 29.3 pg (ref 26.0–34.0)
MCHC: 33.7 g/dL (ref 30.0–36.0)
MCV: 86.8 fL (ref 78.0–100.0)
Platelets: 366 10*3/uL (ref 150–400)
RBC: 3.86 MIL/uL — AB (ref 4.22–5.81)
RDW: 14.6 % (ref 11.5–15.5)
WBC: 5.3 10*3/uL (ref 4.0–10.5)

## 2014-05-21 ENCOUNTER — Ambulatory Visit (INDEPENDENT_AMBULATORY_CARE_PROVIDER_SITE_OTHER): Payer: Self-pay | Admitting: Internal Medicine

## 2014-05-21 ENCOUNTER — Encounter (INDEPENDENT_AMBULATORY_CARE_PROVIDER_SITE_OTHER): Payer: Self-pay | Admitting: Internal Medicine

## 2014-05-21 VITALS — BP 122/50 | HR 80 | Temp 97.1°F | Ht 66.0 in | Wt 186.3 lb

## 2014-05-21 DIAGNOSIS — K279 Peptic ulcer, site unspecified, unspecified as acute or chronic, without hemorrhage or perforation: Secondary | ICD-10-CM

## 2014-05-21 NOTE — Progress Notes (Signed)
Subjective:     Patient ID: Malena PeerDavid Prosser, male   DOB: 04/04/1981, 33 y.o.   MRN: [id number]  HPI Here today for f/u up. Recently admitted to [facility] for diarrhea followed by vomiting. He did not have hematemesis, melena or rectal bleeding.  The moring of admission to [facility] the first of August, patient started passing maroon-colored stools and feeling weak.  He was started on Protoniix IV and received 2 units of PRBS.  Admission hemoglobin at [facility] 4.5. Wife states since discharge he is doing much better. There has been no blood in his stools. Usually has a BM x 3 a day. No melena. No abdominal pain. No vomiting. No dysphagia.  He has not drank any beer ion about 3 weeks.  He previously drank very heavily.  He quit smoking 3 weeks ago.  Recent CBC 11.3.     05/05/2014 EGD Profound anemia, and marooned colored stools: Dr. Rehman:Small prepyloric ulcer with clean base.  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  No evidence of portal gastropathy.   CBC    Component Value Date/Time   WBC 5.3 05/20/2014 0720   RBC 3.86* 05/20/2014 0720   HGB 11.3* 05/20/2014 0720   HCT 33.5* 05/20/2014 0720   PLT 366 05/20/2014 0720   MCV 86.8 05/20/2014 0720   MCH 29.3 05/20/2014 0720   MCHC 33.7 05/20/2014 0720   RDW 14.6 05/20/2014 0720   LYMPHSABS 4.7* 05/04/2014 1039   MONOABS 0.9 05/04/2014 1039   EOSABS 0.0 05/04/2014 1039   BASOSABS 0.0 05/04/2014 1039    .cb    Review of Systems Past Medical History  Diagnosis Date  . Alcohol abuse   . Stomach ulcer 2003  . GI bleeding 2003  . Medical history non-contributory     Past Surgical History  Procedure Laterality Date  . No past surgeries    . Esophagogastroduodenoscopy (egd) with propofol N/A 05/05/2014    Procedure: ESOPHAGOGASTRODUODENOSCOPY (EGD) WITH PROPOFOL;  Surgeon: Malissa HippoNajeeb U Rehman, MD;  Location: [facility];  Service: Endoscopy;  Laterality: N/A;    No Known Allergies  Current Outpatient Prescriptions on  File Prior to Visit  Medication Sig Dispense Refill  . pantoprazole (PROTONIX) 40 MG tablet Take 1 tablet (40 mg total) by mouth 2 (two) times daily before a meal.  60 tablet  0   No current facility-administered medications on file prior to visit.        Objective:   Physical Exam  Filed Vitals:   05/21/14 1504  BP: 122/50  Pulse: 80  Temp: 97.1 F (36.2 C)  Height: 5\' 6"  (1.676 m)  Weight: 186 lb 4.8 oz (84.505 kg)   Alert and oriented. Skin warm and dry. Oral mucosa is moist.   . Sclera anicteric, conjunctivae is pink. Thyroid not enlarged. No cervical lymphadenopathy. Lungs clear. Heart regular rate and rhythm.  Abdomen is soft. Bowel sounds are positive. No hepatomegaly. No abdominal masses felt. No tenderness.  No edema to lower extremities. Patient is alert and oriented.     Assessment:     Recurrent PUD. He has stopped drinking and smoking. No BRRB or melena.  CBC reveals Hemoglobin of 11.3. ? Has H. Pylori. Will check.     Plan:   H. Pylori.  If positive will treat.

## 2014-05-21 NOTE — Patient Instructions (Signed)
EGD in 10-12 weeks from 05/06/2014

## 2014-05-22 LAB — H. PYLORI ANTIBODY, IGG: H Pylori IgG: >8 {ISR} — ABNORMAL HIGH

## 2014-08-14 ENCOUNTER — Encounter (INDEPENDENT_AMBULATORY_CARE_PROVIDER_SITE_OTHER): Payer: Self-pay | Admitting: *Deleted

## 2014-08-19 ENCOUNTER — Encounter (INDEPENDENT_AMBULATORY_CARE_PROVIDER_SITE_OTHER): Payer: Self-pay | Admitting: *Deleted

## 2014-08-19 ENCOUNTER — Other Ambulatory Visit (INDEPENDENT_AMBULATORY_CARE_PROVIDER_SITE_OTHER): Payer: Self-pay | Admitting: *Deleted

## 2014-08-19 DIAGNOSIS — K279 Peptic ulcer, site unspecified, unspecified as acute or chronic, without hemorrhage or perforation: Secondary | ICD-10-CM

## 2014-09-17 ENCOUNTER — Encounter (HOSPITAL_COMMUNITY): Payer: Self-pay | Admitting: *Deleted

## 2014-09-17 ENCOUNTER — Ambulatory Visit (HOSPITAL_COMMUNITY)
Admission: RE | Admit: 2014-09-17 | Discharge: 2014-09-17 | Disposition: A | Discharge status: Home Health | Payer: Medicaid Other | Source: Ambulatory Visit | Attending: Internal Medicine | Admitting: Internal Medicine

## 2014-09-17 ENCOUNTER — Encounter (HOSPITAL_COMMUNITY)
Admission: RE | Disposition: A | Discharge status: Home Health | Payer: Self-pay | Source: Ambulatory Visit | Attending: Internal Medicine

## 2014-09-17 DIAGNOSIS — K279 Peptic ulcer, site unspecified, unspecified as acute or chronic, without hemorrhage or perforation: Secondary | ICD-10-CM

## 2014-09-17 DIAGNOSIS — F1010 Alcohol abuse, uncomplicated: Secondary | ICD-10-CM | POA: Insufficient documentation

## 2014-09-17 DIAGNOSIS — K449 Diaphragmatic hernia without obstruction or gangrene: Secondary | ICD-10-CM

## 2014-09-17 DIAGNOSIS — K210 Gastro-esophageal reflux disease with esophagitis: Secondary | ICD-10-CM

## 2014-09-17 DIAGNOSIS — Z8719 Personal history of other diseases of the digestive system: Secondary | ICD-10-CM

## 2014-09-17 DIAGNOSIS — Z8711 Personal history of peptic ulcer disease: Secondary | ICD-10-CM

## 2014-09-17 DIAGNOSIS — K315 Obstruction of duodenum: Secondary | ICD-10-CM

## 2014-09-17 DIAGNOSIS — K228 Other specified diseases of esophagus: Secondary | ICD-10-CM | POA: Insufficient documentation

## 2014-09-17 DIAGNOSIS — F17210 Nicotine dependence, cigarettes, uncomplicated: Secondary | ICD-10-CM | POA: Insufficient documentation

## 2014-09-17 HISTORY — PX: ESOPHAGOGASTRODUODENOSCOPY: SHX5428

## 2014-09-17 LAB — HEMOGLOBIN AND HEMATOCRIT, BLOOD
HCT: 42.6 % (ref 39.0–52.0)
Hemoglobin: 14.5 g/dL (ref 13.0–17.0)

## 2014-09-17 SURGERY — EGD (ESOPHAGOGASTRODUODENOSCOPY)
Anesthesia: Moderate Sedation

## 2014-09-17 MED ORDER — BUTAMBEN-TETRACAINE-BENZOCAINE 2-2-14 % EX AERO
INHALATION_SPRAY | CUTANEOUS | Status: DC | PRN
  Administered 2014-09-17: 2 via TOPICAL

## 2014-09-17 MED ORDER — STERILE WATER FOR IRRIGATION IR SOLN
Status: DC | PRN
  Administered 2014-09-17: 13:00:00

## 2014-09-17 MED ORDER — OMEPRAZOLE 20 MG PO CPDR: 20.0000 mg | DELAYED_RELEASE_CAPSULE | Freq: Every day | ORAL | Status: DC

## 2014-09-17 MED ORDER — MIDAZOLAM HCL 5 MG/5ML IJ SOLN
INTRAMUSCULAR | Status: AC
  Filled 2014-09-17: qty 10

## 2014-09-17 MED ORDER — MEPERIDINE HCL 50 MG/ML IJ SOLN
INTRAMUSCULAR | Status: DC | PRN
  Administered 2014-09-17: 25 mg via INTRAVENOUS

## 2014-09-17 MED ORDER — MEPERIDINE HCL 50 MG/ML IJ SOLN
INTRAMUSCULAR | Status: AC
  Filled 2014-09-17: qty 1

## 2014-09-17 MED ORDER — SODIUM CHLORIDE 0.9 % IV SOLN
INTRAVENOUS | Status: DC
  Administered 2014-09-17: 12:00:00 via INTRAVENOUS

## 2014-09-17 MED ORDER — MIDAZOLAM HCL 5 MG/5ML IJ SOLN
INTRAMUSCULAR | Status: DC | PRN
  Administered 2014-09-17: 2 mg via INTRAVENOUS

## 2014-09-17 NOTE — Op Note (Signed)
EGD PROCEDURE REPORT  PATIENT:  Calvin Carter  MR#:  [id number] Birthdate:  1981/08/22, 33 y.o., male Endoscopist:  Dr. Malissa HippoNajeeb U. Thais Silberstein, MD  Procedure Date: 09/17/2014  Procedure:   EGD  Indications:  Patient is 33 year old Hispanic male with history of recurrent peptic ulcer disease with GI bleed. He was admitted 4 months ago with 4 unit bleed and found to have duodenal ulcer with stigmata of bleed and stricture. He has been treated for H. pylori infection. He is undergoing EGD to document complete healing of this ulcer.            Informed Consent:  The risks, benefits, alternatives & imponderables which include, but are not limited to, bleeding, infection, perforation, drug reaction and potential missed lesion have been reviewed.  The potential for biopsy, lesion removal, esophageal dilation, etc. have also been discussed.  Questions have been answered.  All parties agreeable.  Please see history & physical in medical record for more information.  Medications:  Demerol 50 mg IV Versed 5 mg IV Cetacaine spray topically for oropharyngeal anesthesia  Description of procedure:  The endoscope was introduced through the mouth and advanced to the second portion of the duodenum without difficulty or limitations. The mucosal surfaces were surveyed very carefully during advancement of the scope and upon withdrawal.  Findings:  Esophagus:  Mucosa of the esophagus was normal. Single erosion noted at GE junction. GE junction was serrated or wavy. GEJ:  38 cm Hiatus:  40 cm Stomach:  Stomach was empty and distended very well with insufflation. Folds in the proximal stomach were normal. Examination of mucosa at gastric body and antrum was normal. Previously identified prepyloric ulcer had completely healed. Pyloric channel was patent. Angularis fundus and cardia but examined by retroflex of the scope and were normal. Duodenum:  Bulbar mucosa was normal. Distal bulbar ulcer had completely healed.  Noncritical post bulbar stricture noted. I was able to past acute scope across it without any difficulty.  Therapeutic/Diagnostic Maneuvers Performed:  Antral biopsy taken for CLOtest.  omplications:  None  Impression: Small sliding hiatal hernia with single erosion at GE junction. Healed prepyloric ulcer. Healed distal bulbar ulcer. Noncritical post bulbar stricture. Antral biopsy taken for CLOtest.  Recommendations:  Anti-reflux measures. Omeprazole 20 mg by mouth every morning. H&H will be checked today. I would be contacting patient results of H&H and CLOtest. Patient advised to refrain from using aspirin and other OTC NSAIDs. Office visit in one year.  [name]  09/17/2014  1:42 PM  CC: Dr. Bonnetta BarryNo PCP Per Patient & Dr. No ref. provider found

## 2014-09-17 NOTE — Discharge Instructions (Signed)
Prilosec OTC 20 mg by mouth 30 minutes before breakfast daily. Remember you cannot take aspirin Advil or Aleve or similar products. No driving for 24 hours. Physician will call with results of hemoglobin, hematocrit and CLOtest.  Gastrointestinal Endoscopy, Care After Refer to this sheet in the next few weeks. These instructions provide you with information on caring for yourself after your procedure. Your caregiver may also give you more specific instructions. Your treatment has been planned according to current medical practices, but problems sometimes occur. Call your caregiver if you have any problems or questions after your procedure. HOME CARE INSTRUCTIONS  If you were given medicine to help you relax (sedative), do not drive, operate machinery, or sign important documents for 24 hours.  Avoid alcohol and hot or warm beverages for the first 24 hours after the procedure.  Only take over-the-counter or prescription medicines for pain, discomfort, or fever as directed by your caregiver. You may resume taking your normal medicines unless your caregiver tells you otherwise. Ask your caregiver when you may resume taking medicines that may cause bleeding, such as aspirin, clopidogrel, or warfarin.  You may return to your normal diet and activities on the day after your procedure, or as directed by your caregiver. Walking may help to reduce any bloated feeling in your abdomen.  Drink enough fluids to keep your urine clear or pale yellow.  You may gargle with salt water if you have a sore throat. SEEK IMMEDIATE MEDICAL CARE IF:  You have severe nausea or vomiting.  You have severe abdominal pain, abdominal cramps that last longer than 6 hours, or abdominal swelling (distention).  You have severe shoulder or back pain.  You have trouble swallowing.  You have shortness of breath, your breathing is shallow, or you are breathing faster than normal.  You have a fever or a rapid  heartbeat.  You vomit blood or material that looks like coffee grounds.  You have bloody, black, or tarry stools. MAKE SURE YOU:  Understand these instructions.  Will watch your condition.  Will get help right away if you are not doing well or get worse. Gastroesophageal Reflux Disease, Adult Gastroesophageal reflux disease (GERD) happens when acid from your stomach flows up into the esophagus. When acid comes in contact with the esophagus, the acid causes soreness (inflammation) in the esophagus. Over time, GERD may create small holes (ulcers) in the lining of the esophagus. CAUSES   Increased body weight. This puts pressure on the stomach, making acid rise from the stomach into the esophagus.  Smoking. This increases acid production in the stomach.  Drinking alcohol. This causes decreased pressure in the lower esophageal sphincter (valve or ring of muscle between the esophagus and stomach), allowing acid from the stomach into the esophagus.  Late evening meals and a full stomach. This increases pressure and acid production in the stomach.  A malformed lower esophageal sphincter. Sometimes, no cause is found. SYMPTOMS   Burning pain in the lower part of the mid-chest behind the breastbone and in the mid-stomach area. This may occur twice a week or more often.  Trouble swallowing.  Sore throat.  Dry cough.  Asthma-like symptoms including chest tightness, shortness of breath, or wheezing. DIAGNOSIS  Your caregiver may be able to diagnose GERD based on your symptoms. In some cases, X-rays and other tests may be done to check for complications or to check the condition of your stomach and esophagus. TREATMENT  Your caregiver may recommend over-the-counter or prescription medicines to  help decrease acid production. Ask your caregiver before starting or adding any new medicines.  HOME CARE INSTRUCTIONS   Change the factors that you can control. Ask your caregiver for guidance  concerning weight loss, quitting smoking, and alcohol consumption.  Avoid foods and drinks that make your symptoms worse, such as:  Caffeine or alcoholic drinks.  Chocolate.  Peppermint or mint flavorings.  Garlic and onions.  Spicy foods.  Citrus fruits, such as oranges, lemons, or limes.  Tomato-based foods such as sauce, chili, salsa, and pizza.  Fried and fatty foods.  Avoid lying down for the 3 hours prior to your bedtime or prior to taking a nap.  Eat small, frequent meals instead of large meals.  Wear loose-fitting clothing. Do not wear anything tight around your waist that causes pressure on your stomach.  Raise the head of your bed 6 to 8 inches with wood blocks to help you sleep. Extra pillows will not help.  Only take over-the-counter or prescription medicines for pain, discomfort, or fever as directed by your caregiver.  Do not take aspirin, ibuprofen, or other nonsteroidal anti-inflammatory drugs (NSAIDs). SEEK IMMEDIATE MEDICAL CARE IF:   You have pain in your arms, neck, jaw, teeth, or back.  Your pain increases or changes in intensity or duration.  You develop nausea, vomiting, or sweating (diaphoresis).  You develop shortness of breath, or you faint.  Your vomit is green, yellow, black, or looks like coffee grounds or blood.  Your stool is red, bloody, or black. These symptoms could be signs of other problems, such as heart disease, gastric bleeding, or esophageal bleeding. MAKE SURE YOU:   Understand these instructions.  Will watch your condition.  Will get help right away if you are not doing well or get worse.

## 2014-09-17 NOTE — H&P (Addendum)
Calvin Carter is an 33 y.o. male.   Chief Complaint: Patient is here for EGD. HPI: Asian is 33 year old Hispanic male with history of recurrent peptic ulcer disease. He was hospitalized 4 months ago with 4 unit bleed. On presentation his hemoglobin was 4.5 g. He was found to have Prepyloric and duodenal ulcers. Since then he's been treated for H. pylori infection with Pylera. He was given 10 days supply/samples from the office and his wife states that he took medication as prescribed. He he was on PPI but not anymore. He denies abdominal pain nausea vomiting or melena. He is undergoing follow-up EGD to document complete healing of peptic ulcer disease.  Past Medical History  Diagnosis Date  . Alcohol abuse   . Stomach ulcer 2003  . GI bleeding 2003  . Medical history non-contributory     Past Surgical History  Procedure Laterality Date  . No past surgeries    . Esophagogastroduodenoscopy (egd) with propofol N/A 05/05/2014    Procedure: ESOPHAGOGASTRODUODENOSCOPY (EGD) WITH PROPOFOL;  Surgeon: Malissa HippoNajeeb U Imagine Nest, MD;  Location: [facility];  Service: Endoscopy;  Laterality: N/A;    History reviewed. No pertinent family history. Social History:  reports that he has been smoking Cigarettes.  He has a .5 pack-year smoking history. He does not have any smokeless tobacco history on file. He reports that he drinks alcohol. He reports that he uses illicit drugs (Cocaine) about once per week.  Allergies: No Known Allergies  No prescriptions prior to admission    No results found for this or any previous visit (from the past 48 hour(s)). No results found.  ROS  Blood pressure 115/67, pulse 62, temperature 97.7 F (36.5 C), temperature source Oral, resp. rate 16, SpO2 100 %. Physical Exam  Constitutional: He appears well-developed and well-nourished.  HENT:  Mouth/Throat: Oropharynx is clear and moist.  Eyes: Conjunctivae are normal. No scleral icterus.  Neck: No thyromegaly present.   Cardiovascular: Normal rate, regular rhythm and normal heart sounds.   GI: Soft. He exhibits no distension and no mass. There is no tenderness.  Musculoskeletal: He exhibits no edema.  Lymphadenopathy:    He has no cervical adenopathy.  Neurological: He is alert.  Skin: Skin is warm and dry.     Assessment/Plan Recurrent peptic ulcer disease with history of GI bleed. Patient has been treated for H. pylori gastritis. EGD to document healing of ulcers.  Merry Pond U 09/17/2014, 1:16 PM

## 2014-09-18 LAB — CLOTEST (H. PYLORI), BIOPSY: Helicobacter screen: NEGATIVE

## 2014-09-19 ENCOUNTER — Encounter (HOSPITAL_COMMUNITY): Payer: Self-pay | Admitting: Internal Medicine

## 2015-06-22 ENCOUNTER — Encounter (INDEPENDENT_AMBULATORY_CARE_PROVIDER_SITE_OTHER): Payer: Self-pay | Admitting: *Deleted

## 2015-06-24 ENCOUNTER — Emergency Department (HOSPITAL_COMMUNITY)
Admission: EM | Admit: 2015-06-24 | Discharge: 2015-06-24 | Disposition: A | Discharge status: Home / Self Care | Payer: Medicaid Other | Attending: Emergency Medicine | Admitting: Emergency Medicine

## 2015-06-24 ENCOUNTER — Encounter (HOSPITAL_COMMUNITY): Payer: Self-pay | Admitting: Emergency Medicine

## 2015-06-24 DIAGNOSIS — Z720 Tobacco use: Secondary | ICD-10-CM | POA: Insufficient documentation

## 2015-06-24 DIAGNOSIS — R1032 Left lower quadrant pain: Secondary | ICD-10-CM | POA: Insufficient documentation

## 2015-06-24 DIAGNOSIS — Z8659 Personal history of other mental and behavioral disorders: Secondary | ICD-10-CM | POA: Insufficient documentation

## 2015-06-24 DIAGNOSIS — Z8719 Personal history of other diseases of the digestive system: Secondary | ICD-10-CM | POA: Insufficient documentation

## 2015-06-24 DIAGNOSIS — Z79899 Other long term (current) drug therapy: Secondary | ICD-10-CM | POA: Insufficient documentation

## 2015-06-24 LAB — CBC WITH DIFFERENTIAL/PLATELET
Basophils Absolute: 0 10*3/uL (ref 0.0–0.1)
Basophils Relative: 0 %
Eosinophils Absolute: 0.1 10*3/uL (ref 0.0–0.7)
Eosinophils Relative: 2 %
HCT: 43.6 % (ref 39.0–52.0)
HEMOGLOBIN: 15.5 g/dL (ref 13.0–17.0)
LYMPHS ABS: 2.4 10*3/uL (ref 0.7–4.0)
LYMPHS PCT: 33 %
MCH: 31.9 pg (ref 26.0–34.0)
MCHC: 35.6 g/dL (ref 30.0–36.0)
MCV: 89.7 fL (ref 78.0–100.0)
Monocytes Absolute: 0.7 10*3/uL (ref 0.1–1.0)
Monocytes Relative: 10 %
NEUTROS PCT: 55 %
Neutro Abs: 4 10*3/uL (ref 1.7–7.7)
Platelets: 247 10*3/uL (ref 150–400)
RBC: 4.86 MIL/uL (ref 4.22–5.81)
RDW: 12.6 % (ref 11.5–15.5)
WBC: 7.3 10*3/uL (ref 4.0–10.5)

## 2015-06-24 LAB — BASIC METABOLIC PANEL
Anion gap: 4 — ABNORMAL LOW (ref 5–15)
BUN: 15 mg/dL (ref 6–20)
CHLORIDE: 104 mmol/L (ref 101–111)
CO2: 30 mmol/L (ref 22–32)
Calcium: 9.3 mg/dL (ref 8.9–10.3)
Creatinine, Ser: 1.22 mg/dL (ref 0.61–1.24)
GFR calc Af Amer: >60 mL/min (ref >60)
GFR calc non Af Amer: >60 mL/min (ref >60)
Glucose, Bld: 69 mg/dL (ref 65–99)
POTASSIUM: 4.1 mmol/L (ref 3.5–5.1)
Sodium: 138 mmol/L (ref 135–145)

## 2015-06-24 LAB — URINE MICROSCOPIC-ADD ON

## 2015-06-24 LAB — URINALYSIS, ROUTINE W REFLEX MICROSCOPIC
Bilirubin Urine: NEGATIVE
GLUCOSE, UA: NEGATIVE mg/dL
Ketones, ur: NEGATIVE mg/dL
LEUKOCYTES UA: NEGATIVE
Nitrite: NEGATIVE
PH: 6 (ref 5.0–8.0)
PROTEIN: NEGATIVE mg/dL
Specific Gravity, Urine: >1.03 — ABNORMAL HIGH (ref 1.005–1.030)
Urobilinogen, UA: 0.2 mg/dL (ref 0.0–1.0)

## 2015-06-24 MED ORDER — NAPROXEN 500 MG PO TABS: 500.0000 mg | ORAL_TABLET | Freq: Two times a day (BID) | ORAL | Status: DC

## 2015-06-24 NOTE — Discharge Instructions (Signed)

## 2015-06-24 NOTE — ED Notes (Signed)
Pain to groin, rates 9/10.  C/o burning at site of groin.

## 2015-06-24 NOTE — ED Provider Notes (Signed)
CSN: [id number]     Arrival date & time 06/24/15  1746 History   First MD Initiated Contact with Patient 06/24/15 1829     Chief Complaint  Patient presents with  . Groin Pain   HPI Patient presents to the emergency room with complaints of burning severe groin pain. The patient has had intermittent episodes of this type of pain for several years. He has been evaluated the health department in the past. He was told he was going to get a referral to another type of doctor but never had a follow-up appointment. He denies any fevers or chills. No vomiting or diarrhea. The pain is in the left groin area. Sometimes when he urinates he has a burning discomfort. He has not noticed any swelling. No problems with his appetite Past Medical History  Diagnosis Date  . Alcohol abuse   . Stomach ulcer 2003  . GI bleeding 2003  . Medical history non-contributory    Past Surgical History  Procedure Laterality Date  . No past surgeries    . Esophagogastroduodenoscopy (egd) with propofol N/A 05/05/2014    Procedure: ESOPHAGOGASTRODUODENOSCOPY (EGD) WITH PROPOFOL;  Surgeon: Malissa Hippo, MD;  Location: [facility];  Service: Endoscopy;  Laterality: N/A;  . Esophagogastroduodenoscopy N/A 09/17/2014    Procedure: ESOPHAGOGASTRODUODENOSCOPY (EGD);  Surgeon: Malissa Hippo, MD;  Location: [facility];  Service: Endoscopy;  Laterality: N/A;  100   History reviewed. No pertinent family history. Social History  Substance Use Topics  . Smoking status: Current Some Day Smoker -- 0.25 packs/day for 2 years    Types: Cigarettes  . Smokeless tobacco: None     Comment: Pt lethargic and uninvolved with inital admission screen; Family at bedside   . Alcohol Use: Yes     Comment: moderate    Review of Systems  All other systems reviewed and are negative.     Allergies  Review of patient's allergies indicates no known allergies.  Home Medications   Prior to Admission medications   Medication Sig Start Date  End Date Taking? Authorizing Heidemarie Goodnow  naproxen (NAPROSYN) 500 MG tablet Take 1 tablet (500 mg total) by mouth 2 (two) times daily. 06/24/15   Linwood Dibbles, MD  omeprazole (PRILOSEC) 20 MG capsule Take 1 capsule (20 mg total) by mouth daily before breakfast. 09/17/14   Malissa Hippo, MD   BP 128/76 mmHg  Pulse 86  Temp(Src) 97.8 F (36.6 C) (Oral)  Resp 13  Ht  (1.676 m)  Wt 203 lb 7 oz (92.279 kg)  BMI 32.85 kg/m2  SpO2 99% Physical Exam  Constitutional: He appears well-developed and well-nourished. No distress.  HENT:  Head: Normocephalic and atraumatic.  Right Ear: External ear normal.  Left Ear: External ear normal.  Eyes: Conjunctivae are normal. Right eye exhibits no discharge. Left eye exhibits no discharge. No scleral icterus.  Neck: Neck supple. No tracheal deviation present.  Cardiovascular: Normal rate, regular rhythm and intact distal pulses.   Pulmonary/Chest: Effort normal and breath sounds normal. No stridor. No respiratory distress. He has no wheezes. He has no rales.  Abdominal: Soft. Bowel sounds are normal. He exhibits no distension. There is no tenderness. There is no rebound and no guarding. Hernia confirmed negative in the right inguinal area and confirmed negative in the left inguinal area.  Genitourinary: Penis normal. Right testis shows no mass, no swelling and no tenderness. Left testis shows no mass, no swelling and no tenderness. Uncircumcised. No penile erythema. No discharge  found.  Musculoskeletal: He exhibits no edema or tenderness.  Neurological: He is alert. He has normal strength. No cranial nerve deficit (no facial droop, extraocular movements intact, no slurred speech) or sensory deficit. He exhibits normal muscle tone. He displays no seizure activity. Coordination normal.  Skin: Skin is warm and dry. No rash noted.  Psychiatric: He has a normal mood and affect.  Nursing note and vitals reviewed.   ED Course  Procedures (including critical care  time) Labs Review Labs Reviewed  BASIC METABOLIC PANEL - Abnormal; Notable for the following:    Anion gap 4 (*)    All other components within normal limits  URINALYSIS, ROUTINE W REFLEX MICROSCOPIC (NOT AT [facility]) - Abnormal; Notable for the following:    Specific Gravity, Urine >1.030 (*)    Hgb urine dipstick TRACE (*)    All other components within normal limits  CBC WITH DIFFERENTIAL/PLATELET  URINE MICROSCOPIC-ADD ON     MDM   Final diagnoses:  Groin pain, left    Benign exam.  No abnormalities on labs.  No clear etiology for this burning pain in his groin.  No sign of infection or torsion.  Doubt ureteral stone.  Trace hemoglobin but no RBCs.  Will dc home on nsaids.  Follow up with urology.    Linwood Dibbles, MD 06/24/15 2008

## 2015-09-15 ENCOUNTER — Ambulatory Visit (INDEPENDENT_AMBULATORY_CARE_PROVIDER_SITE_OTHER): Payer: Self-pay | Admitting: Urology

## 2015-09-15 DIAGNOSIS — R1030 Lower abdominal pain, unspecified: Secondary | ICD-10-CM

## 2015-09-15 DIAGNOSIS — R102 Pelvic and perineal pain: Secondary | ICD-10-CM

## 2015-09-21 ENCOUNTER — Ambulatory Visit (INDEPENDENT_AMBULATORY_CARE_PROVIDER_SITE_OTHER): Payer: Medicaid Other | Admitting: Internal Medicine

## 2015-10-26 ENCOUNTER — Ambulatory Visit (INDEPENDENT_AMBULATORY_CARE_PROVIDER_SITE_OTHER): Payer: Medicaid Other | Admitting: Internal Medicine

## 2015-10-27 ENCOUNTER — Ambulatory Visit: Payer: Medicaid Other | Admitting: Urology

## 2015-11-17 ENCOUNTER — Ambulatory Visit (INDEPENDENT_AMBULATORY_CARE_PROVIDER_SITE_OTHER): Payer: Self-pay | Admitting: Urology

## 2015-11-17 DIAGNOSIS — R102 Pelvic and perineal pain: Secondary | ICD-10-CM

## 2015-11-25 ENCOUNTER — Ambulatory Visit (INDEPENDENT_AMBULATORY_CARE_PROVIDER_SITE_OTHER): Payer: Medicaid Other | Admitting: Internal Medicine

## 2015-11-30 ENCOUNTER — Ambulatory Visit (INDEPENDENT_AMBULATORY_CARE_PROVIDER_SITE_OTHER): Payer: Medicaid Other | Admitting: Internal Medicine

## 2015-12-01 ENCOUNTER — Encounter (INDEPENDENT_AMBULATORY_CARE_PROVIDER_SITE_OTHER): Payer: Self-pay | Admitting: Internal Medicine

## 2016-02-16 ENCOUNTER — Ambulatory Visit: Payer: Medicaid Other | Admitting: Urology

## 2016-04-19 ENCOUNTER — Ambulatory Visit (INDEPENDENT_AMBULATORY_CARE_PROVIDER_SITE_OTHER): Payer: Medicaid Other | Admitting: Urology

## 2016-04-19 DIAGNOSIS — R102 Pelvic and perineal pain: Secondary | ICD-10-CM

## 2017-10-10 ENCOUNTER — Ambulatory Visit (INDEPENDENT_AMBULATORY_CARE_PROVIDER_SITE_OTHER): Payer: Self-pay | Admitting: Urology

## 2017-10-10 DIAGNOSIS — R102 Pelvic and perineal pain: Secondary | ICD-10-CM

## 2018-02-27 ENCOUNTER — Emergency Department (HOSPITAL_COMMUNITY)
Admission: EM | Admit: 2018-02-27 | Discharge: 2018-02-28 | Disposition: A | Discharge status: Home / Self Care | Payer: Self-pay | Attending: Emergency Medicine | Admitting: Emergency Medicine

## 2018-02-27 ENCOUNTER — Encounter (HOSPITAL_COMMUNITY): Payer: Self-pay | Admitting: *Deleted

## 2018-02-27 ENCOUNTER — Emergency Department (HOSPITAL_COMMUNITY): Payer: Self-pay

## 2018-02-27 DIAGNOSIS — F17210 Nicotine dependence, cigarettes, uncomplicated: Secondary | ICD-10-CM | POA: Insufficient documentation

## 2018-02-27 DIAGNOSIS — S6992XA Unspecified injury of left wrist, hand and finger(s), initial encounter: Secondary | ICD-10-CM

## 2018-02-27 DIAGNOSIS — M79645 Pain in left finger(s): Secondary | ICD-10-CM | POA: Insufficient documentation

## 2018-02-27 NOTE — ED Provider Notes (Signed)
[facility] EMERGENCY DEPARTMENT Provider Note   CSN: [id number] Arrival date & time: 02/27/18  1808     History   Chief Complaint Chief Complaint  Patient presents with  . Hand Injury    HPI Calvin Carter is a 37 y.o. male.  Patient works as a Visual merchandiser. He cut his left thumb with a carpet knife one week ago. He did not seek treatment at that time.  He has had increasing redness and swelling of the skin underlying the injury, with purulent drainage. Has been soaking in warm salt water at home. Tetanus up to date.  The history is provided by the patient and the spouse.  Hand Injury   The incident occurred more than 1 week ago. The incident occurred at work. The injury mechanism was an incision. The pain is present in the left fingers. The pain is mild. The pain has been fluctuating since the incident. Pertinent negatives include no fever. He reports no foreign bodies present. The symptoms are aggravated by movement, use and palpation.    Past Medical History:  Diagnosis Date  . Alcohol abuse   . GI bleeding 2003  . Medical history non-contributory   . Stomach ulcer 2003    Patient Active Problem List   Diagnosis Date Noted  . PUD (peptic ulcer disease) 05/21/2014  . GI bleeding 05/04/2014  . Acute blood loss anemia 05/04/2014  . GI bleed 05/04/2014  . Alcohol abuse 05/04/2014    Past Surgical History:  Procedure Laterality Date  . ESOPHAGOGASTRODUODENOSCOPY N/A 09/17/2014   Procedure: ESOPHAGOGASTRODUODENOSCOPY (EGD);  Surgeon: Malissa Hippo, MD;  Location: [facility];  Service: Endoscopy;  Laterality: N/A;  100  . ESOPHAGOGASTRODUODENOSCOPY (EGD) WITH PROPOFOL N/A 05/05/2014   Procedure: ESOPHAGOGASTRODUODENOSCOPY (EGD) WITH PROPOFOL;  Surgeon: Malissa Hippo, MD;  Location: [facility];  Service: Endoscopy;  Laterality: N/A;  . NO PAST SURGERIES          Home Medications    Prior to Admission medications   Not on File    Family History History  reviewed. No pertinent family history.  Social History Social History   Tobacco Use  . Smoking status: Current Some Day Smoker    Packs/day: 0.25    Years: 2.00    Pack years: 0.50    Types: Cigarettes  . Smokeless tobacco: Never Used  . Tobacco comment: denies use 02/27/18  Substance Use Topics  . Alcohol use: Yes    Comment: moderate  . Drug use: Not Currently    Frequency: 1.0 times per week    Types: Cocaine     Allergies   Patient has no known allergies.   Review of Systems Review of Systems  Constitutional: Negative for fever.  Skin: Positive for wound.  All other systems reviewed and are negative.    Physical Exam Updated Vital Signs BP 122/76 (BP Location: Right Arm)   Pulse (!) 56   Temp 98.6 F (37 C) (Oral)   Resp 14   SpO2 98%   Physical Exam  Constitutional: He is oriented to person, place, and time. He appears well-developed and well-nourished. No distress.  HENT:  Head: Atraumatic.  Eyes: Conjunctivae are normal.  Neck: Normal range of motion.  Cardiovascular: Normal rate and regular rhythm.  Pulmonary/Chest: Effort normal and breath sounds normal.  Musculoskeletal: He exhibits edema and tenderness.       Hands: 0.7 cm laceration overlying the lateral aspect of the dorsum of the left DP joint of thumb.  Neurological: He is alert and oriented to person, place, and time.  Skin: Skin is warm and dry.  Psychiatric: He has a normal mood and affect.  Nursing note and vitals reviewed.    Induration without fluctuance.  ED Treatments / Results  Labs (all labs ordered are listed, but only abnormal results are displayed) Labs Reviewed - No data to display  EKG None  Radiology Dg Finger Thumb Left  Result Date: 02/27/2018 CLINICAL DATA:  Pain and swelling since cutting LEFT thumb 1 week ago. Assess for osteomyelitis. EXAM: LEFT THUMB 2+V COMPARISON:  None. FINDINGS: There is no evidence of fracture or dislocation. There is no evidence of  arthropathy or destructive bony lesions. Soft tissue swelling without subcutaneous gas or radiopaque foreign bodies IMPRESSION: Soft tissue swelling without radiographic findings of osteomyelitis. Electronically Signed   By: Awilda Metro M.D.   On: 02/27/2018 19:52    Procedures Procedures (including critical care time)  Medications Ordered in ED Medications - No data to display   Initial Impression / Assessment and Plan / ED Course  I have reviewed the triage vital signs and the nursing notes.  Pertinent labs & imaging results that were available during my care of the patient were reviewed by me and considered in my medical decision making (see chart for details).     Concern for septic tenosynovitis. Consult with hand (Gramig).  Discussed with Dr. Amanda Pea.  Warm soaks, start on doxycycline, will see in the office on 5/29 at noon.  Patient with infection of laceration of thumb of left hand. Concern for possible septic tenosynovitis. Dr. Amanda Pea will see patient  in the office. Patient started on doxycycline, first dose given in ED. Care instructions provided. Return precautions discussed.  Final Clinical Impressions(s) / ED Diagnoses   Final diagnoses:  Injury of left hand, initial encounter    ED Discharge Orders        Ordered    doxycycline (VIBRAMYCIN) 100 MG capsule  2 times daily     02/28/18 0042       Felicie Morn, NP 02/28/18 1610    Dione Booze, MD 02/28/18 [phone]

## 2018-02-27 NOTE — ED Triage Notes (Signed)
Pt with cut to left thumb last week, and did not see anyone concerning it. Pt with swelling since Friday, worse on Sunday, drainage as well.

## 2018-02-28 MED ORDER — DOXYCYCLINE HYCLATE 100 MG PO TABS
100.0000 mg | ORAL_TABLET | Freq: Once | ORAL | Status: AC
  Administered 2018-02-28: 100 mg via ORAL
  Filled 2018-02-28: qty 1

## 2018-02-28 MED ORDER — DOXYCYCLINE HYCLATE 100 MG PO CAPS: 100.0000 mg | ORAL_CAPSULE | Freq: Two times a day (BID) | ORAL | 0 refills | Status: DC

## 2018-02-28 NOTE — Discharge Instructions (Addendum)
Continue with warm soaks of the hand. Take the antibiotic as directed. Follow-up with Dr. Amanda Pea in his office at noon on 02/28/18.

## 2019-09-24 ENCOUNTER — Encounter: Payer: Self-pay | Admitting: Urology

## 2022-05-02 ENCOUNTER — Other Ambulatory Visit: Payer: Self-pay

## 2022-05-02 ENCOUNTER — Emergency Department (HOSPITAL_COMMUNITY): Payer: Self-pay

## 2022-05-02 ENCOUNTER — Encounter (HOSPITAL_COMMUNITY): Payer: Self-pay | Admitting: *Deleted

## 2022-05-02 ENCOUNTER — Emergency Department (HOSPITAL_COMMUNITY)
Admission: EM | Admit: 2022-05-02 | Discharge: 2022-05-03 | Disposition: A | Discharge status: Home / Self Care | Payer: Self-pay | Attending: Emergency Medicine | Admitting: Emergency Medicine

## 2022-05-02 DIAGNOSIS — M25561 Pain in right knee: Secondary | ICD-10-CM

## 2022-05-02 DIAGNOSIS — M25461 Effusion, right knee: Secondary | ICD-10-CM | POA: Insufficient documentation

## 2022-05-02 LAB — CBC WITH DIFFERENTIAL/PLATELET
Abs Immature Granulocytes: 0.02 10*3/uL (ref 0.00–0.07)
Basophils Absolute: 0 10*3/uL (ref 0.0–0.1)
Basophils Relative: 0 %
Eosinophils Absolute: 0.2 10*3/uL (ref 0.0–0.5)
Eosinophils Relative: 2 %
HCT: 40.4 % (ref 39.0–52.0)
Hemoglobin: 13.5 g/dL (ref 13.0–17.0)
Immature Granulocytes: 0 %
Lymphocytes Relative: 32 %
Lymphs Abs: 2.5 10*3/uL (ref 0.7–4.0)
MCH: 31.7 pg (ref 26.0–34.0)
MCHC: 33.4 g/dL (ref 30.0–36.0)
MCV: 94.8 fL (ref 80.0–100.0)
Monocytes Absolute: 0.8 10*3/uL (ref 0.1–1.0)
Monocytes Relative: 10 %
Neutro Abs: 4.3 10*3/uL (ref 1.7–7.7)
Neutrophils Relative %: 56 %
Platelets: 226 10*3/uL (ref 150–400)
RBC: 4.26 MIL/uL (ref 4.22–5.81)
RDW: 12.8 % (ref 11.5–15.5)
WBC: 7.7 10*3/uL (ref 4.0–10.5)
nRBC: 0 % (ref 0.0–0.2)

## 2022-05-02 LAB — COMPREHENSIVE METABOLIC PANEL
ALT: 25 U/L (ref 0–44)
AST: 22 U/L (ref 15–41)
Albumin: 3.6 g/dL (ref 3.5–5.0)
Alkaline Phosphatase: 63 U/L (ref 38–126)
Anion gap: 6 (ref 5–15)
BUN: 10 mg/dL (ref 6–20)
CO2: 25 mmol/L (ref 22–32)
Calcium: 8.4 mg/dL — ABNORMAL LOW (ref 8.9–10.3)
Chloride: 107 mmol/L (ref 98–111)
Creatinine, Ser: 0.7 mg/dL (ref 0.61–1.24)
GFR, Estimated: >60 mL/min (ref >60)
Glucose, Bld: 109 mg/dL — ABNORMAL HIGH (ref 70–99)
Potassium: 4 mmol/L (ref 3.5–5.1)
Sodium: 138 mmol/L (ref 135–145)
Total Bilirubin: 0.5 mg/dL (ref 0.3–1.2)
Total Protein: 7 g/dL (ref 6.5–8.1)

## 2022-05-02 LAB — SYNOVIAL CELL COUNT + DIFF, W/ CRYSTALS
Crystals, Fluid: NONE SEEN
Lymphocytes-Synovial Fld: 17 % (ref 0–20)
Monocyte-Macrophage-Synovial Fluid: 10 % — ABNORMAL LOW (ref 50–90)
Neutrophil, Synovial: 73 % — ABNORMAL HIGH (ref 0–25)
WBC, Synovial: 2860 /mm3 — ABNORMAL HIGH (ref 0–200)

## 2022-05-02 LAB — SEDIMENTATION RATE: Sed Rate: 23 mm/hr — ABNORMAL HIGH (ref 0–16)

## 2022-05-02 LAB — GRAM STAIN: Gram Stain: NONE SEEN

## 2022-05-02 LAB — C-REACTIVE PROTEIN: CRP: 9.7 mg/dL — ABNORMAL HIGH (ref <1.0)

## 2022-05-02 MED ORDER — DOXYCYCLINE HYCLATE 100 MG PO CAPS: 100.0000 mg | ORAL_CAPSULE | Freq: Two times a day (BID) | ORAL | 0 refills | Status: AC

## 2022-05-02 MED ORDER — DOXYCYCLINE HYCLATE 100 MG PO TABS
100.0000 mg | ORAL_TABLET | Freq: Once | ORAL | Status: AC
  Administered 2022-05-03: 100 mg via ORAL
  Filled 2022-05-02: qty 1

## 2022-05-02 MED ORDER — ACETAMINOPHEN 325 MG PO TABS
650.0000 mg | ORAL_TABLET | Freq: Once | ORAL | Status: AC
  Administered 2022-05-03: 650 mg via ORAL
  Filled 2022-05-02: qty 2

## 2022-05-02 MED ORDER — LIDOCAINE-EPINEPHRINE (PF) 2 %-1:200000 IJ SOLN
10.0000 mL | Freq: Once | INTRAMUSCULAR | Status: AC
  Administered 2022-05-02: 10 mL
  Filled 2022-05-02: qty 20

## 2022-05-02 MED ORDER — OXYCODONE-ACETAMINOPHEN 5-325 MG PO TABS
1.0000 | ORAL_TABLET | Freq: Once | ORAL | Status: AC
  Administered 2022-05-02: 1 via ORAL
  Filled 2022-05-02: qty 1

## 2022-05-02 NOTE — Discharge Instructions (Addendum)
An antibiotic has been sent to your pharmacy by the name of doxycycline.  Please take 1 capsule every 12 hours for the next 7 days.  Do this until the course has been completed.  Always take with plenty of food and water.  The contact information for the local orthopedic specialist Dr. Dallas Schimke has been provided for you.  Please call them first thing tomorrow morning to be seen later in the day.  Continue to manage muscle aches and low-grade fevers with both ibuprofen and Tylenol.  Return to the ED for new or worsening symptoms as discussed.

## 2022-05-02 NOTE — ED Provider Notes (Signed)
[facility] EMERGENCY DEPARTMENT Provider Note   CSN: [id number] Arrival date & time: 05/02/22  1158     History {Add pertinent medical, surgical, social history, OB history to HPI:1} Chief Complaint  Patient presents with   Knee Pain    Calvin Carter is a 41 y.o. male with hx of PUD, prior GI bleed, and alcohol abuse.  Presented today with chief complaint of right knee pain, swelling, and erythema.  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  That morning before work the knee was asymptomatic.  Pain worsened with bending the knee, walking, and pressure on.  Patient woke up this morning covered in sweat.  Denies shortness of breath, chest pain, neck stiffness, headache, vision changes.  Denies IV drug use or recent infection.  Without N/V/D.  Denies urinary symptoms or penile discharge or pain.  No known Hx of diabetes, gout, or septic arthritis.   The history is provided by the patient and medical records. The history is limited by a language barrier. A language interpreter was used (Pt's spouse).  Knee Pain      Home Medications Prior to Admission medications   Medication Sig Start Date End Date Taking? Authorizing Provider  doxycycline (VIBRAMYCIN) 100 MG capsule Take 1 capsule (100 mg total) by mouth 2 (two) times daily. One po bid x 7 days 02/28/18   Etta Quill, NP      Allergies    Patient has no known allergies.    Review of Systems   Review of Systems  Musculoskeletal:  Positive for joint swelling.    Physical Exam Updated Vital Signs BP 122/78 (BP Location: Right Arm)   Pulse (!) 54   Temp 98.2 F (36.8 C) (Oral)   Resp 16   Ht $R'5\' 6"'TI$  (1.676 m)   Wt 92.3 kg   SpO2 99%   BMI 32.84 kg/m  Physical Exam Vitals and nursing note reviewed.  Constitutional:      General: He is not in acute distress.    Appearance: Normal appearance. He is well-developed. He is not ill-appearing, toxic-appearing or  diaphoretic.  HENT:     Head: Normocephalic and atraumatic.  Eyes:     Conjunctiva/sclera: Conjunctivae normal.  Neck:     Comments: Neck very supple on exam Cardiovascular:     Rate and Rhythm: Normal rate and regular rhythm.     Pulses: Normal pulses.     Heart sounds: No murmur heard. Pulmonary:     Effort: Pulmonary effort is normal. No respiratory distress.     Breath sounds: Normal breath sounds.  Abdominal:     Palpations: Abdomen is soft.     Tenderness: There is no abdominal tenderness.  Musculoskeletal:        General: Swelling and tenderness present.     Cervical back: Neck supple. No rigidity.     Comments: Swelling, erythema, tenderness, and warmth of the right knee.  Mild tenderness with flexion from 0 to 60 degrees, with significant tenderness elicited from 60 to 90 degrees.  Sensation intact.  Skin:    General: Skin is warm and dry.     Capillary Refill: Capillary refill takes less than 2 seconds.  Neurological:     Mental Status: He is alert and oriented to person, place, and time.  Psychiatric:        Mood and Affect: Mood normal.     ED Results / Procedures / Treatments   Labs (  all labs ordered are listed, but only abnormal results are displayed) Labs Reviewed  COMPREHENSIVE METABOLIC PANEL - Abnormal; Notable for the following components:      Result Value   Glucose, Bld 109 (*)    Calcium 8.4 (*)    All other components within normal limits  SEDIMENTATION RATE - Abnormal; Notable for the following components:   Sed Rate 23 (*)    All other components within normal limits  CULTURE, BLOOD (ROUTINE X 2)  CULTURE, BLOOD (ROUTINE X 2)  BODY FLUID CULTURE W GRAM STAIN  GRAM STAIN  CBC WITH DIFFERENTIAL/PLATELET  C-REACTIVE PROTEIN  SYNOVIAL CELL COUNT + DIFF, W/ CRYSTALS  GLUCOSE, BODY FLUID OTHER              EKG None  Radiology DG Knee Complete 4 Views Right  Result Date: 05/02/2022 CLINICAL DATA:  Right knee pain and swelling anteriorly for  several days. No reported injury. EXAM: RIGHT KNEE - COMPLETE 4+ VIEW COMPARISON:  None Available. FINDINGS: No fracture, significant joint effusion or dislocation. No suspicious focal osseous lesions. Mild lateral compartment and minimal patellofemoral compartment osteoarthritis. Generalized soft tissue swelling, most prominent in the prepatellar and lateral right knee. No radiopaque foreign bodies. IMPRESSION: Generalized soft tissue swelling, most prominent in the prepatellar and lateral right knee. No acute osseous abnormality. No significant joint effusion. 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 Electronically Signed   By: Ilona Sorrel M.D.   On: 05/02/2022 13:18    Procedures Procedures  {Document cardiac monitor, telemetry assessment procedure when appropriate:1}  Medications Ordered in ED Medications  oxyCODONE-acetaminophen (PERCOCET/ROXICET) 5-325 MG per tablet 1 tablet (1 tablet Oral Given 05/02/22 1358)  lidocaine-EPINEPHrine (XYLOCAINE W/EPI) 2 %-1:200000 (PF) injection 10 mL (10 mLs Infiltration Given 05/02/22 1745)    ED Course/ Medical Decision Making/ A&P                           Medical Decision Making Amount and/or Complexity of Data Reviewed Labs: ordered. Radiology: ordered.  Risk Prescription drug management.   41 y.o. male presents to the ED for concern of Knee Pain   This involves an extensive number of treatment options, and is a complaint that carries with it a high risk of complications and morbidity.  The emergent differential diagnosis prior to evaluation includes, but is not limited to: Gout, septic arthritis, cellulitis, DVT  This is not an exhaustive differential.   Past Medical History / Co-morbidities / Social History: Hx of PUD, prior GI bleed, and alcohol abuse Social Determinants of Health include alcohol abuse, for which cessation counseling was provided  Additional History:  None  Lab Tests: I ordered,  and personally interpreted labs.  The pertinent results include:   CBC: Unremarkable CMP/BMP: Glucose 109 ESR: 23, elevated CRP: *** Blood cultures: Pending Gram stain: *** Cell count/crystal:  *** Body fluid culture: Pending Glucose, body fluid: ***  Imaging Studies: I ordered imaging studies including XR right knee.   I independently visualized and interpreted imaging which showed negative for significant effusion, fracture, or dislocation; significant for soft tissue swelling I agree with the radiologist interpretation.  ED Course: Pt well-appearing on exam.  Nontoxic, nonseptic appearing in NAD.  Sudden onset knee pain 2 days ago.  Pain continues to increase.  Appears to have had fever/chills this morning, which I find concerning.  No known Hx of gout, septic arthritis, recent infection.  Without urinary, penile, or scrotal  symptoms.  Lower initial suspicion of STD related infection.  No N/V/D.  No known Hx of diabetes or specific preceding injury.  No recent upper respiratory infection.  Plan to proceed with labs and ultrasound assessment for possible effusion.  Differential still large, considering gout, infectious arthritis, cellulitis, and DVT at this time.  Pain managed in ED. if effusion present, plan to proceed with aspiration and analysis. Upon reevaluation [place] indicative of fluid present in right knee joint.  Aspiration performed by Dr. Doren Custard, please see his note for details.  Fluid appeared golden yellow, without obvious pus or blood present.  Sent off for analysis to differentiate between gout vs infectious arthritis.  ESR elevated at 23, CRP ***. Patient in NAD and in good condition at time of discharge.  Disposition: After consideration the patient's encounter today, I do not feel today's workup suggests an emergent condition requiring admission or immediate intervention beyond what has been performed at this time.  Safe for discharge; instructed to return immediately for  worsening symptoms, change in symptoms or any other concerns.  I have reviewed the patients home medicines and have made adjustments as needed.  Discussed course of treatment with the patient, whom demonstrated understanding.  Patient in agreement and has no further questions.    This patient was a shared case encounter with my attending, Dr. Doren Custard, who directly contributed to the proposed treatment course and cosigned this note including patient's presenting symptoms, physical exam, and planned diagnostics and interventions.  Attending physician stated agreement with plan or made changes to plan which were implemented.     This chart was dictated using voice recognition software.  Despite best efforts to proofread, errors can occur which can change the documentation meaning.   {Document critical care time when appropriate:1} {Document review of labs and clinical decision tools ie heart score, Chads2Vasc2 etc:1}  {Document your independent review of radiology images, and any outside records:1} {Document your discussion with family members, caretakers, and with consultants:1} {Document social determinants of health affecting pt's care:1} {Document your decision making why or why not admission, treatments were needed:1} Final Clinical Impression(s) / ED Diagnoses Final diagnoses:  None    Rx / DC Orders ED Discharge Orders     None

## 2022-05-02 NOTE — ED Triage Notes (Signed)
Pt presents with right knee pain Saturday, denies any injury.  Swelling started Saturday night. Denies known fevers.

## 2022-05-03 ENCOUNTER — Ambulatory Visit (INDEPENDENT_AMBULATORY_CARE_PROVIDER_SITE_OTHER): Payer: Self-pay | Admitting: Orthopedic Surgery

## 2022-05-03 ENCOUNTER — Encounter (HOSPITAL_COMMUNITY): Payer: Self-pay | Admitting: Emergency Medicine

## 2022-05-03 ENCOUNTER — Other Ambulatory Visit: Payer: Self-pay

## 2022-05-03 ENCOUNTER — Encounter: Payer: Self-pay | Admitting: Orthopedic Surgery

## 2022-05-03 ENCOUNTER — Emergency Department (HOSPITAL_COMMUNITY)
Admission: EM | Admit: 2022-05-03 | Discharge: 2022-05-03 | Disposition: A | Discharge status: Home / Self Care | Payer: Self-pay | Attending: Emergency Medicine | Admitting: Emergency Medicine

## 2022-05-03 VITALS — BP 124/83 | HR 71 | Ht 66.0 in | Wt 203.0 lb

## 2022-05-03 DIAGNOSIS — M7041 Prepatellar bursitis, right knee: Secondary | ICD-10-CM

## 2022-05-03 DIAGNOSIS — M25561 Pain in right knee: Secondary | ICD-10-CM | POA: Insufficient documentation

## 2022-05-03 LAB — BLOOD CULTURE ID PANEL (REFLEXED) - BCID2

## 2022-05-03 LAB — GLUCOSE, BODY FLUID OTHER: Glucose, Body Fluid Other: 101 mg/dL

## 2022-05-03 LAB — CULTURE, BLOOD (ROUTINE X 2)

## 2022-05-03 NOTE — Discharge Instructions (Signed)
Your blood culture was positive for what is suspected to be contamination from when you had the blood cultures drawn.  Please continue to take the antibiotic prescribed yesterday.  Return to the emergency department if you develop fever, worsening pain or swelling.

## 2022-05-03 NOTE — ED Triage Notes (Signed)
Pt presents for positive blood cultures from right septic knee.

## 2022-05-03 NOTE — Patient Instructions (Signed)
Note for work - out for 2 weeks  ROM as tolerated.  Crutches as needed.

## 2022-05-03 NOTE — ED Provider Notes (Signed)
[facility] EMERGENCY DEPARTMENT Provider Note   CSN: [id number] Arrival date & time: 05/03/22  1352     History  No chief complaint on file.   Calvin Carter is a 41 y.o. male.  Patient presents to the emergency department after being called in regards to positive blood cultures.  Patient was seen in the emergency department yesterday with knee swelling.  He had an aspiration done.  He was started on doxycycline.  Patient followed up with orthopedics this morning.  Ultimate diagnosis prepatellar bursitis.  Blood cultures returned today with Staphylococcus species, specifically Staph epidermidis in 1 anaerobic bottle.  Joint aspirate culture has not grown any organisms to this point.  Gram stain was negative yesterday.  Patient denies any new symptoms.  He denies current fevers or chills.  No nausea or vomiting.  He states that the knee pain and swelling is gradually improving.  He has been taking the oral antibiotics.       Home Medications Prior to Admission medications   Medication Sig Start Date End Date Taking? Authorizing Provider  doxycycline (VIBRAMYCIN) 100 MG capsule Take 1 capsule (100 mg total) by mouth 2 (two) times daily for 7 days. 05/02/22 05/09/22 Yes Cecil Cobbs, PA-C      Allergies    Patient has no known allergies.    Review of Systems   Review of Systems  Physical Exam Updated Vital Signs BP 130/82 (BP Location: Right Arm)   Pulse 63   Temp 97.9 F (36.6 C) (Oral)   Resp 16   Ht 5\' 6"  (1.676 m)   Wt 92.1 kg   SpO2 98%   BMI 32.77 kg/m  Physical Exam Vitals and nursing note reviewed.  Constitutional:      Appearance: He is well-developed.  HENT:     Head: Normocephalic and atraumatic.  Eyes:     Conjunctiva/sclera: Conjunctivae normal.  Pulmonary:     Effort: No respiratory distress.  Musculoskeletal:     Cervical back: Normal range of motion and neck supple.     Comments: Patient with generalized swelling over the anterior aspect of the  right knee.  Effusion noted.  No abscess.  Location of needle aspiration appears to be healing well.  No drainage.  Patient is able to flex at the knee with some discomfort.  Skin:    General: Skin is warm and dry.  Neurological:     Mental Status: He is alert.     ED Results / Procedures / Treatments   Labs (all labs ordered are listed, but only abnormal results are displayed) Labs Reviewed - No data to display  EKG None  Radiology DG Knee Complete 4 Views Right  Result Date: 05/02/2022 CLINICAL DATA:  Right knee pain and swelling anteriorly for several days. No reported injury. EXAM: RIGHT KNEE - COMPLETE 4+ VIEW COMPARISON:  None Available. FINDINGS: No fracture, significant joint effusion or dislocation. No suspicious focal osseous lesions. Mild lateral compartment and minimal patellofemoral compartment osteoarthritis. Generalized soft tissue swelling, most prominent in the prepatellar and lateral right knee. No radiopaque foreign bodies. IMPRESSION: Generalized soft tissue swelling, most prominent in the prepatellar and lateral right knee. No acute osseous abnormality. No significant joint effusion. 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 Electronically Signed   By: 05/04/2022 M.D.   On: 05/02/2022 13:18    Procedures Procedures    Medications Ordered in ED Medications - No data to display  ED Course/ Medical Decision Making/ A&P  Patient seen and examined. History obtained directly from patient.  Also reviewed external orthopedic note from earlier today.  Reviewed blood culture results.  Reviewed ED notes from yesterday.  Discussed with Dr. Durwin Nora.  Labs/EKG: None ordered  Imaging: None ordered  Medications/Fluids: None ordered.  Most recent vital signs reviewed and are as follows: BP 130/82 (BP Location: Right Arm)   Pulse 63   Temp 97.9 F (36.6 C) (Oral)   Resp 16   Ht 5\' 6"  (1.676 m)   Wt 92.1 kg   SpO2 98%   BMI  32.77 kg/m   Initial impression: Positive blood culture, likely contamination as this is Staph epidermidis and only 1 anaerobic bottle.  Patient without signs of sepsis or progressive signs of infection.  He looks well, nontoxic.  H&P, work-up, and plan reviewed with: Dr.  Home treatment plan: Continue elevation, pain control, oral antibiotics  Return instructions discussed with patient: Return with fever, vomiting  Follow-up instructions discussed with patient: Follow-up with orthopedics as planned.                          Medical Decision Making  Patient with recent knee swelling, prepatellar bursitis.  Seen in ED yesterday, followed with Ortho today.  Blood cultures positive for Staph epidermidis.  This is likely a contaminant.  Patient appears well, nontoxic.  Low concern for bacteremia or sepsis at this time.  The patient's vital signs, pertinent lab work and imaging were reviewed and interpreted as discussed in the ED course. Hospitalization was considered for further testing, treatments, or serial exams/observation. However as patient is well-appearing, has a stable exam, and reassuring studies today, I do not feel that they warrant admission at this time. This plan was discussed with the patient who verbalizes agreement and comfort with this plan and seems reliable and able to return to the Emergency Department with worsening or changing symptoms.          Final Clinical Impression(s) / ED Diagnoses Final diagnoses:  Acute pain of right knee    Rx / DC Orders ED Discharge Orders     None         Durwin Nora, PA-C 05/03/22 1509    07/03/22, MD 05/03/22 1816

## 2022-05-03 NOTE — ED Provider Notes (Signed)
.  Joint Aspiration/Arthrocentesis  Date/Time: 05/03/2022 7:23 AM  Performed by: Gloris Manchester, MD Authorized by: Gloris Manchester, MD   Consent:    Consent obtained:  Verbal   Consent given by:  Patient and spouse   Risks, benefits, and alternatives were discussed: yes     Risks discussed:  Bleeding, infection and pain   Alternatives discussed:  No treatment and delayed treatment Universal protocol:    Procedure explained and questions answered to patient or proxy's satisfaction: yes     Imaging studies available: yes     Patient identity confirmed:  Verbally with patient Location:    Location:  Knee   Knee:  R knee Anesthesia:    Anesthesia method:  Local infiltration Procedure details:    Needle gauge:  18 G   Ultrasound guidance: yes     Approach:  Lateral   Aspirate amount:  10cc   Aspirate characteristics:  Yellow   Steroid injected: no   Post-procedure details:    Dressing:  Adhesive bandage   Procedure completion:  Tolerated well, no immediate complications     Gloris Manchester, MD 05/03/22 [phone]

## 2022-05-04 LAB — CULTURE, BLOOD (ROUTINE X 2): Special Requests: ADEQUATE

## 2022-05-04 NOTE — Progress Notes (Signed)
New Patient Visit  Assessment: Calvin Carter is a 41 y.o. male with the following: 1. Prepatellar bursitis of right knee  Plan: Malena Peer had spontaneous onset of right knee pain, with some associated swelling and warmth to his right knee.  2 days ago, he did have fevers and chills.  Since aspiration of his right knee in the emergency department yesterday, his pain improved.  The fluid analysis was negative for a septic joint.  He does have some increased swelling in the prepatellar space, but I am not concerned about an infection at this point.  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  No need for I&D today.  He does not need to restrict his activities.  I did provide some compression with an Ace wrap.  I do want him to continue taking the antibiotics prescribed by the emergency department.  I will see him back in 2 weeks.  If something changes before his scheduled appointment, he will contact the clinic.  Follow-up: Return in about 2 weeks (around 05/17/2022).  Subjective:  Chief Complaint  Patient presents with   New Patient (Initial Visit)   Knee Pain    RT knee//NKI Seen in ED 05/02/22//aspirated while in ED    History of Present Illness: Calvin Carter is a 41 y.o. male who presents for evaluation of right knee pain.  He notes a couple day history of increasing pain, swelling and redness about the right knee.  No specific injury.  He is a Corporate investment banker.  Over the weekend, he states that he started to feel some pain, restricted range of motion and warmth in his right knee.  He did have some fevers and chills.  He has not had fevers or chills for 2 days now.  He presented to the emergency department yesterday.  His right knee was aspirated, and this was negative for septic arthritis.  He has been given doxycycline.  Since aspiration, the pain and range of motion  has improved.  No prior injuries to his right knee.  No recent illness.   Review of Systems: No fevers or chills No numbness or tingling No chest pain No shortness of breath No bowel or bladder dysfunction No GI distress No headaches   Medical History:  Past Medical History:  Diagnosis Date   Alcohol abuse    GI bleeding 2003   Medical history non-contributory    Stomach ulcer 2003    Past Surgical History:  Procedure Laterality Date   ESOPHAGOGASTRODUODENOSCOPY N/A 09/17/2014   Procedure: ESOPHAGOGASTRODUODENOSCOPY (EGD);  Surgeon: Malissa Hippo, MD;  Location: [facility];  Service: Endoscopy;  Laterality: N/A;  100   ESOPHAGOGASTRODUODENOSCOPY (EGD) WITH PROPOFOL N/A 05/05/2014   Procedure: ESOPHAGOGASTRODUODENOSCOPY (EGD) WITH PROPOFOL;  Surgeon: Malissa Hippo, MD;  Location: [facility] ORS;  Service: Endoscopy;  Laterality: N/A;   NO PAST SURGERIES      No family history on file. Social History   Tobacco Use   Smoking status: Former    Packs/day: 0.25    Years: 2.00    Total pack years: 0.50    Types: Cigarettes   Smokeless tobacco: Never   Tobacco comments:    denies use 02/27/18  Substance Use Topics   Alcohol use: Yes    Comment: moderate   Drug use: Not Currently    Frequency: 1.0 times per week    Types: Cocaine  Comment: denies    No Known Allergies  Current Meds  Medication Sig   doxycycline (VIBRAMYCIN) 100 MG capsule Take 1 capsule (100 mg total) by mouth 2 (two) times daily for 7 days.    Objective: BP 124/83   Pulse 71   Ht 5\' 6"  (1.676 m)   Wt 203 lb (92.1 kg)   BMI 32.77 kg/m   Physical Exam:  General: Alert and oriented. and No acute distress. Gait: Right sided antalgic gait.  Uses a crutch.  Right knee with minimal swelling.  Some swelling in the prepatellar space.  Some warmth.  The anterior aspect the knee is boggy to touch.  No fluctuance is appreciated.  No drainage.  He has full range of motion without reservation.  No  point tenderness.  No increased laxity varus or valgus stress.  Negative Lachman.         IMAGING: I personally reviewed images previously obtained from the ED  Negative right knee x-ray.  Mild swelling in the anterior knee.  New Medications:  No orders of the defined types were placed in this encounter.     , MD  05/04/2022 7:52 AM

## 2022-05-05 LAB — CULTURE, BLOOD (ROUTINE X 2)

## 2022-05-06 LAB — BODY FLUID CULTURE W GRAM STAIN: Culture: NO GROWTH

## 2022-05-07 LAB — CULTURE, BLOOD (ROUTINE X 2)
Culture: NO GROWTH
Special Requests: ADEQUATE

## 2022-05-17 ENCOUNTER — Encounter: Payer: Self-pay | Admitting: Orthopedic Surgery

## 2022-05-17 ENCOUNTER — Ambulatory Visit (INDEPENDENT_AMBULATORY_CARE_PROVIDER_SITE_OTHER): Payer: Self-pay | Admitting: Orthopedic Surgery

## 2022-05-17 VITALS — Ht 66.0 in | Wt 203.0 lb

## 2022-05-17 DIAGNOSIS — M7041 Prepatellar bursitis, right knee: Secondary | ICD-10-CM

## 2022-05-17 NOTE — Progress Notes (Signed)
Orthopaedic Clinic Return  Assessment: Calvin Carter is a 41 y.o. male with the following: Right knee, prepatellar bursitis  Plan: Pain, swelling and redness have all significantly improved.  He has more mobility.  He has no pain in his right knee.  Recommended he use a knee pad, if completing for work.  Otherwise, consider a sleeve for some compression.  Medications as needed.  He can use topicals or patches.  Follow-up as needed.  Follow-up: Return if symptoms worsen or fail to improve.   Subjective:  Chief Complaint  Patient presents with   Knee Pain    Rt knee pain f/u no more pain    History of Present Illness: Calvin Carter is a 41 y.o. male who returns to clinic for repeat evaluation of right knee pain.  I saw him in clinic approximately 2 weeks ago and he had anterior right knee swelling, redness and pain.  He was ambulating with the assistance of crutches at that time.  Since I last saw him, his pain is improved.  His swelling is much better.  He has returned to work.  Review of Systems: No fevers or chills No numbness or tingling No chest pain No shortness of breath No bowel or bladder dysfunction No GI distress No headaches   Objective: Ht 5\' 6"  (1.676 m)   Wt 203 lb (92.1 kg)   BMI 32.77 kg/m   Physical Exam:  Alert and oriented.  No acute distress.  Right knee without redness or heat.  Small amount of swelling in the prepatellar space.  He has full range of motion.  Negative Lachman.  No increased laxity varus valgus stress.  He walks with a nonantalgic gait.  IMAGING: I personally ordered and reviewed the following images:  No imaging obtained today.  , MD 05/17/2022 4:24 PM
